# Patient Record
Sex: Male | Born: 1970 | Race: White | Hispanic: No | Marital: Married | Smoking: Former smoker
Health system: Southern US, Community
[De-identification: ages and names within clinical notes are randomized; demographics above are authoritative.]

## PROBLEM LIST (undated history)

## (undated) DIAGNOSIS — F419 Anxiety disorder, unspecified: Secondary | ICD-10-CM

## (undated) DIAGNOSIS — E785 Hyperlipidemia, unspecified: Secondary | ICD-10-CM

## (undated) DIAGNOSIS — T7840XA Allergy, unspecified, initial encounter: Secondary | ICD-10-CM

## (undated) DIAGNOSIS — R5383 Other fatigue: Secondary | ICD-10-CM

## (undated) DIAGNOSIS — Z9109 Other allergy status, other than to drugs and biological substances: Secondary | ICD-10-CM

## (undated) DIAGNOSIS — Z9852 Vasectomy status: Secondary | ICD-10-CM

## (undated) DIAGNOSIS — M109 Gout, unspecified: Secondary | ICD-10-CM

## (undated) DIAGNOSIS — B019 Varicella without complication: Secondary | ICD-10-CM

## (undated) HISTORY — DX: Other allergy status, other than to drugs and biological substances: Z91.09

## (undated) HISTORY — DX: Hyperlipidemia, unspecified: E78.5

## (undated) HISTORY — DX: Vasectomy status: Z98.52

## (undated) HISTORY — DX: Varicella without complication: B01.9

## (undated) HISTORY — DX: Gout, unspecified: M10.9

## (undated) HISTORY — DX: Other fatigue: R53.83

## (undated) HISTORY — DX: Allergy, unspecified, initial encounter: T78.40XA

---
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
## 2000-02-24 ENCOUNTER — Emergency Department (HOSPITAL_COMMUNITY): Admission: EM | Admit: 2000-02-24 | Discharge: 2000-02-24 | Payer: Self-pay | Admitting: Emergency Medicine

## 2000-02-24 ENCOUNTER — Encounter: Payer: Self-pay | Admitting: Emergency Medicine

## 2005-12-06 ENCOUNTER — Ambulatory Visit: Payer: Self-pay | Admitting: Gastroenterology

## 2005-12-09 ENCOUNTER — Ambulatory Visit: Payer: Self-pay | Admitting: Cardiovascular Disease

## 2011-06-16 ENCOUNTER — Encounter: Payer: Self-pay | Admitting: *Deleted

## 2011-06-28 ENCOUNTER — Encounter: Payer: Self-pay | Admitting: Cardiovascular Disease

## 2011-06-28 ENCOUNTER — Encounter: Payer: Self-pay | Admitting: *Deleted

## 2011-06-28 ENCOUNTER — Ambulatory Visit (INDEPENDENT_AMBULATORY_CARE_PROVIDER_SITE_OTHER): Payer: Self-pay | Admitting: Cardiovascular Disease

## 2011-06-28 DIAGNOSIS — R079 Chest pain, unspecified: Secondary | ICD-10-CM | POA: Insufficient documentation

## 2011-06-28 DIAGNOSIS — R002 Palpitations: Secondary | ICD-10-CM | POA: Insufficient documentation

## 2011-06-28 DIAGNOSIS — Z0000 Encounter for general adult medical examination without abnormal findings: Secondary | ICD-10-CM

## 2011-06-28 NOTE — Patient Instructions (Signed)
Your physician recommends that you schedule a follow-up appointment in:AS NEEDED You have been referred to PRIMARY MEDICAL  DOCTOR  [facility] OFFICE  Your physician has requested that you have an exercise tolerance test. For further information please visit [email or web address] Please also follow instruction sheet, as given. DX CHEST PAIN

## 2011-06-28 NOTE — Progress Notes (Signed)
Patient ID: Cody Hawkins, male   DOB: 10/27/1970, 41 y.o.   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  Symtoms last month or so.  Atypical aching in left breast area.  Not exertional.  Lots of stress from job.  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  Has a 42 yo and 42 yo with different woman and relationship with older son not great.  Denies drugs or stimulants.  Palpitations are fluttering more at night and when he lays down.  No syncope or family history of sudden death.  Plays basketball for recreation with no difficulty.  No previous cardiac w/u.  Only takes ASA.    ROS: Denies fever, malais, weight loss, blurry vision, decreased visual acuity, cough, sputum, SOB, hemoptysis, pleuritic pain, palpitaitons, heartburn, abdominal pain, melena, lower extremity edema, claudication, or rash.  All other systems reviewed and negative   General: Affect appropriate Healthy:  appears stated age HEENT: normal Neck supple with no adenopathy JVP normal no bruits no thyromegaly Lungs clear with no wheezing and good diaphragmatic motion Heart:  S1/S2 no murmur,rub, gallop or click PMI normal Abdomen: benighn, BS positve, no tenderness, no AAA no bruit.  No HSM or HJR Distal pulses intact with no bruits No edema Neuro non-focal Skin warm and dry No muscular weakness  Medications Current Outpatient Prescriptions  Medication Sig Dispense Refill  . aspirin 81 MG tablet Take 81 mg by mouth daily.        Allergies Tetracyclines & related  Family History: No family history on file.  Social History: History   Social History  . Marital Status: Married    Spouse Name: N/A    Number of Children: N/A  . Years of Education: N/A   Occupational History  . Not on file.   Social History Main Topics  . Smoking status: Former Smoker    Quit date: 06/20/2007  . Smokeless tobacco: Never Used  . Alcohol Use: Not on file    . Drug Use: Not on file  . Sexually Active: Not on file   Other Topics Concern  . Not on file   Social History Narrative   Patient is married and lives with his wife and twochildren.  He has a Master's degree in accounting.  He quit smoking sixmonths ago and uses beer on the weekends.  He denies a problem withalcoholism or drug abuse.    Electrocardiogram:  NSR rate 80 normal ecg  Assessment and Plan

## 2011-06-28 NOTE — Assessment & Plan Note (Signed)
Atypical likely related to job stress.  F/U ETT

## 2011-06-28 NOTE — Assessment & Plan Note (Signed)
Benign no need for monitoring in setting of normal ECG

## 2011-07-04 ENCOUNTER — Ambulatory Visit (INDEPENDENT_AMBULATORY_CARE_PROVIDER_SITE_OTHER): Payer: BC Managed Care – PPO | Admitting: Family Medicine

## 2011-07-04 ENCOUNTER — Encounter: Payer: Self-pay | Admitting: Family Medicine

## 2011-07-04 DIAGNOSIS — Z13220 Encounter for screening for lipoid disorders: Secondary | ICD-10-CM

## 2011-07-04 DIAGNOSIS — Z23 Encounter for immunization: Secondary | ICD-10-CM

## 2011-07-04 DIAGNOSIS — Z0000 Encounter for general adult medical examination without abnormal findings: Secondary | ICD-10-CM

## 2011-07-04 LAB — CBC WITH DIFFERENTIAL/PLATELET
Basophils Absolute: 0 10*3/uL (ref 0.0–0.1)
Basophils Relative: 0.3 % (ref 0.0–3.0)
Eosinophils Absolute: 0.2 10*3/uL (ref 0.0–0.7)
Eosinophils Relative: 2.6 % (ref 0.0–5.0)
HCT: 45.6 % (ref 39.0–52.0)
Hemoglobin: 15.5 g/dL (ref 13.0–17.0)
Lymphocytes Relative: 26.3 % (ref 12.0–46.0)
Lymphs Abs: 1.7 10*3/uL (ref 0.7–4.0)
MCHC: 34 g/dL (ref 30.0–36.0)
MCV: 89.2 fl (ref 78.0–100.0)
Monocytes Absolute: 0.3 10*3/uL (ref 0.1–1.0)
Monocytes Relative: 5.3 % (ref 3.0–12.0)
Neutro Abs: 4.2 10*3/uL (ref 1.4–7.7)
Neutrophils Relative %: 65.5 % (ref 43.0–77.0)
Platelets: 187 10*3/uL (ref 150.0–400.0)
RBC: 5.11 Mil/uL (ref 4.22–5.81)
RDW: 12.8 % (ref 11.5–14.6)
WBC: 6.5 10*3/uL (ref 4.5–10.5)

## 2011-07-04 LAB — BASIC METABOLIC PANEL
BUN: 13 mg/dL (ref 6–23)
CO2: 29 mEq/L (ref 19–32)
Calcium: 9.4 mg/dL (ref 8.4–10.5)
Chloride: 102 mEq/L (ref 96–112)
Creatinine, Ser: 0.8 mg/dL (ref 0.4–1.5)
GFR: 106.44 mL/min (ref >60.00)
Glucose, Bld: 97 mg/dL (ref 70–99)
Potassium: 4.2 mEq/L (ref 3.5–5.1)
Sodium: 140 mEq/L (ref 135–145)

## 2011-07-04 LAB — PSA: PSA: 3.61 ng/mL (ref 0.10–4.00)

## 2011-07-04 LAB — LIPID PANEL
HDL: 51.7 mg/dL (ref >39.00)
Triglycerides: 82 mg/dL (ref 0.0–149.0)
VLDL: 16.4 mg/dL (ref 0.0–40.0)

## 2011-07-04 LAB — HEPATIC FUNCTION PANEL
ALT: 37 U/L (ref 0–53)
AST: 31 U/L (ref 0–37)
Albumin: 4.5 g/dL (ref 3.5–5.2)
Alkaline Phosphatase: 81 U/L (ref 39–117)
Bilirubin, Direct: 0.1 mg/dL (ref 0.0–0.3)
Total Bilirubin: 0.6 mg/dL (ref 0.3–1.2)
Total Protein: 7 g/dL (ref 6.0–8.3)

## 2011-07-04 LAB — LDL CHOLESTEROL, DIRECT: Direct LDL: 137.1 mg/dL

## 2011-07-04 LAB — TSH: TSH: 0.77 u[IU]/mL (ref 0.35–5.50)

## 2011-07-04 MED ORDER — TETANUS-DIPHTH-ACELL PERTUSSIS 5-2.5-18.5 LF-MCG/0.5 IM SUSP
0.5000 mL | Freq: Once | INTRAMUSCULAR | Status: AC
  Administered 2011-07-04: 0.5 mL via INTRAMUSCULAR

## 2011-07-04 NOTE — Patient Instructions (Signed)
Follow up in 1 year or as needed We'll notify you of your lab results Keep up the good work on diet and exercise- you look great! Call with any questions or concerns Think of [place] as your home base Welcome!  We're glad to have you!

## 2011-07-04 NOTE — Progress Notes (Signed)
  Subjective:    Patient ID: Cody Hawkins, male    DOB: 1971-02-20, 41 y.o.   MRN: [id number]  HPI New to establish.  No previous PCP.    No concerns today.  Has not had CPE in ~12 yrs.   Review of Systems Patient reports no vision/hearing changes, anorexia, fever ,adenopathy, persistant/recurrent hoarseness, swallowing issues, edema, persistant/recurrent cough, hemoptysis, dyspnea (rest,exertional, paroxysmal nocturnal), gastrointestinal  bleeding (melena, rectal bleeding), abdominal pain, excessive heart burn, GU symptoms (dysuria, hematuria, voiding/incontinence issues) syncope, focal weakness, memory loss, numbness & tingling, skin/hair/nail changes, depression, anxiety, abnormal bruising/bleeding, musculoskeletal symptoms/signs.   Intermittent CP and palpitations, saw Dr Eden Emms    Objective:   Physical Exam BP 135/75  Pulse 80  Temp(Src) 98.4 F (36.9 C) (Oral)  Ht 5' 9.75" (1.772 m)  Wt 179 lb 9.6 oz (81.466 kg)  BMI 25.95 kg/m2  SpO2 98%  General Appearance:    Alert, cooperative, no distress, appears stated age  Head:    Normocephalic, without obvious abnormality, atraumatic  Eyes:    PERRL, conjunctiva/corneas clear, EOM's intact, fundi    benign, both eyes       Ears:    Normal TM's and external ear canals, both ears  Nose:   Nares normal, septum midline, mucosa normal, no drainage   or sinus tenderness  Throat:   Lips, mucosa, and tongue normal; teeth and gums normal  Neck:   Supple, symmetrical, trachea midline, no adenopathy;       thyroid:  No enlargement/tenderness/nodules  Back:     Symmetric, no curvature, ROM normal, no CVA tenderness  Lungs:     Clear to auscultation bilaterally, respirations unlabored  Chest wall:    No tenderness or deformity  Heart:    Regular rate and rhythm, S1 and S2 normal, no murmur, rub   or gallop  Abdomen:     Soft, non-tender, bowel sounds active all four quadrants,    no masses, no organomegaly  Genitalia:    Normal male  without lesion, discharge or tenderness  Rectal:    Deferred due to young age  Extremities:   Extremities normal, atraumatic, no cyanosis or edema  Pulses:   2+ and symmetric all extremities  Skin:   Skin color, texture, turgor normal, no rashes or lesions  Lymph nodes:   Cervical, supraclavicular, and axillary nodes normal  Neurologic:   CNII-XII intact. Normal strength, sensation and reflexes      throughout          Assessment & Plan:

## 2011-07-19 NOTE — Assessment & Plan Note (Signed)
New.  Pt's PE WNL.  Check labs.  Anticipatory guidance provided.  

## 2011-07-25 ENCOUNTER — Encounter: Payer: BC Managed Care – PPO | Admitting: Cardiovascular Disease

## 2012-07-03 ENCOUNTER — Encounter: Payer: BC Managed Care – PPO | Admitting: Family Medicine

## 2012-07-10 ENCOUNTER — Encounter: Payer: BC Managed Care – PPO | Admitting: Family Medicine

## 2012-08-20 ENCOUNTER — Encounter: Payer: Self-pay | Admitting: Family Medicine

## 2012-08-20 ENCOUNTER — Ambulatory Visit (INDEPENDENT_AMBULATORY_CARE_PROVIDER_SITE_OTHER): Payer: BC Managed Care – PPO | Admitting: Family Medicine

## 2012-08-20 VITALS — BP 108/70 | HR 86 | Temp 98.1°F | Ht 71.5 in | Wt 174.2 lb

## 2012-08-20 DIAGNOSIS — R6882 Decreased libido: Secondary | ICD-10-CM | POA: Insufficient documentation

## 2012-08-20 DIAGNOSIS — Z1331 Encounter for screening for depression: Secondary | ICD-10-CM

## 2012-08-20 DIAGNOSIS — F419 Anxiety disorder, unspecified: Secondary | ICD-10-CM | POA: Insufficient documentation

## 2012-08-20 DIAGNOSIS — F32A Depression, unspecified: Secondary | ICD-10-CM | POA: Insufficient documentation

## 2012-08-20 DIAGNOSIS — Z0000 Encounter for general adult medical examination without abnormal findings: Secondary | ICD-10-CM

## 2012-08-20 DIAGNOSIS — F341 Dysthymic disorder: Secondary | ICD-10-CM

## 2012-08-20 DIAGNOSIS — F329 Major depressive disorder, single episode, unspecified: Secondary | ICD-10-CM | POA: Insufficient documentation

## 2012-08-20 NOTE — Assessment & Plan Note (Signed)
Pt's PE WNL.  Check labs.  Anticipatory guidance provided.  

## 2012-08-20 NOTE — Progress Notes (Signed)
  Subjective:    Patient ID: Cody Hawkins, male    DOB: 04/01/1970, 42 y.o.   MRN: [id number]  HPI CPE- pt reports decreased energy, increased stress at work, increased anxiety, poor sleep, decreased concentration.  Now reports sex life is suffering due to decreased drive.  No obvious changes in muscle mass.   Review of Systems Patient reports no vision/hearing changes, anorexia, fever ,adenopathy, swallowing issues, chest pain, palpitations, edema, persistant/recurrent cough, hemoptysis, dyspnea (rest,exertional, paroxysmal nocturnal), gastrointestinal  bleeding (melena, rectal bleeding), abdominal pain, excessive heart burn, GU symptoms (dysuria, hematuria, voiding/incontinence issues) syncope, focal weakness, memory loss, numbness & tingling, skin/hair/nail changes, abnormal bruising/bleeding, musculoskeletal symptoms/signs.   + hoarseness x6 months, frequent talking.    Objective:   Physical Exam BP 108/70  Pulse 86  Temp(Src) 98.1 F (36.7 C) (Oral)  Ht 5' 11.5" (1.816 m)  Wt 174 lb 3.2 oz (79.017 kg)  BMI 23.96 kg/m2  SpO2 96%  General Appearance:    Alert, cooperative, no distress, appears stated age  Head:    Normocephalic, without obvious abnormality, atraumatic  Eyes:    PERRL, conjunctiva/corneas clear, EOM's intact, fundi    benign, both eyes       Ears:    Normal TM's and external ear canals, both ears  Nose:   Nares normal, septum midline, mucosa normal, no drainage   or sinus tenderness  Throat:   Lips, mucosa, and tongue normal; teeth and gums normal  Neck:   Supple, symmetrical, trachea midline, no adenopathy;       thyroid:  No enlargement/tenderness/nodules  Back:     Symmetric, no curvature, ROM normal, no CVA tenderness  Lungs:     Clear to auscultation bilaterally, respirations unlabored  Chest wall:    No tenderness or deformity  Heart:    Regular rate and rhythm, S1 and S2 normal, no murmur, rub   or gallop  Abdomen:     Soft, non-tender, bowel sounds  active all four quadrants,    no masses, no organomegaly  Genitalia:    Normal male without lesion, discharge or tenderness  Rectal:    Deferred due to young age  Extremities:   Extremities normal, atraumatic, no cyanosis or edema  Pulses:   2+ and symmetric all extremities  Skin:   Skin color, texture, turgor normal, no rashes or lesions  Lymph nodes:   Cervical, supraclavicular, and axillary nodes normal  Neurologic:   CNII-XII intact. Normal strength, sensation and reflexes      throughout          Assessment & Plan:

## 2012-08-20 NOTE — Assessment & Plan Note (Signed)
New.  Suspect this is anxiety/depression related but will check testosterone to r/o low T.  Pt expressed understanding and is in agreement w/ plan.

## 2012-08-20 NOTE — Patient Instructions (Addendum)
We'll notify you of your lab results and make any changes if needed If the testosterone is normal, consider adding a low dose depression/anxiety med Keep up the good work!  You look great! Call with any questions or concerns Have a great summer!!!

## 2012-08-20 NOTE — Assessment & Plan Note (Signed)
New.  Pt feels this is mostly centered around work.  Discussed starting meds if pt's testosterone is WNL.  Pt to think about this while waiting on labs- will follow closely.

## 2012-08-21 LAB — LIPID PANEL
Cholesterol: 200 mg/dL (ref 0–200)
HDL: 45.5 mg/dL (ref >39.00)
LDL Cholesterol: 124 mg/dL — ABNORMAL HIGH (ref 0–99)
Total CHOL/HDL Ratio: 4
Triglycerides: 154 mg/dL — ABNORMAL HIGH (ref 0.0–149.0)
VLDL: 30.8 mg/dL (ref 0.0–40.0)

## 2012-08-21 LAB — CBC WITH DIFFERENTIAL/PLATELET
Basophils Absolute: 0 10*3/uL (ref 0.0–0.1)
Eosinophils Absolute: 0 10*3/uL (ref 0.0–0.7)
HCT: 45.8 % (ref 39.0–52.0)
Lymphs Abs: 1.4 10*3/uL (ref 0.7–4.0)
MCHC: 34.1 g/dL (ref 30.0–36.0)
Monocytes Relative: 3.5 % (ref 3.0–12.0)
Neutro Abs: 6.1 10*3/uL (ref 1.4–7.7)
Platelets: 208 10*3/uL (ref 150.0–400.0)
RDW: 13.3 % (ref 11.5–14.6)

## 2012-08-21 LAB — BASIC METABOLIC PANEL
CO2: 25 mEq/L (ref 19–32)
Calcium: 9.4 mg/dL (ref 8.4–10.5)
Creatinine, Ser: 0.9 mg/dL (ref 0.4–1.5)
GFR: 104.44 mL/min (ref >60.00)
Sodium: 139 mEq/L (ref 135–145)

## 2012-08-21 LAB — HEPATIC FUNCTION PANEL
Alkaline Phosphatase: 67 U/L (ref 39–117)
Bilirubin, Direct: 0.1 mg/dL (ref 0.0–0.3)
Total Bilirubin: 0.6 mg/dL (ref 0.3–1.2)
Total Protein: 7.1 g/dL (ref 6.0–8.3)

## 2012-08-21 LAB — TESTOSTERONE, FREE, TOTAL, SHBG: Testosterone, Free: 101.4 pg/mL (ref 47.0–244.0)

## 2012-08-23 ENCOUNTER — Telehealth: Payer: Self-pay | Admitting: *Deleted

## 2012-08-23 MED ORDER — BUPROPION HCL ER (XL) 150 MG PO TB24: 150.0000 mg | ORAL_TABLET | Freq: Every day | ORAL | Status: DC

## 2012-08-23 NOTE — Telephone Encounter (Signed)
New rx sent to the pharmary by e-script.//AB/CMA

## 2012-08-23 NOTE — Telephone Encounter (Signed)
Message copied by Verdie Shire on Thu Aug 23, 2012  1:57 PM ------      Message from: Sheliah Hatch      Created: Wed Aug 22, 2012  2:41 PM       Please send script for Wellbutrin XL 150mg  daily, #30, 3 refills ------

## 2012-12-12 ENCOUNTER — Other Ambulatory Visit: Payer: Self-pay | Admitting: Family Medicine

## 2012-12-13 NOTE — Telephone Encounter (Signed)
Rx filled and sent to [facility]

## 2013-01-24 ENCOUNTER — Other Ambulatory Visit: Payer: Self-pay

## 2013-03-15 ENCOUNTER — Other Ambulatory Visit: Payer: Self-pay | Admitting: Family Medicine

## 2013-03-18 NOTE — Telephone Encounter (Signed)
Med filled.  

## 2013-06-10 ENCOUNTER — Other Ambulatory Visit: Payer: Self-pay | Admitting: Family Medicine

## 2013-06-10 NOTE — Telephone Encounter (Signed)
Med filled.  

## 2013-11-28 ENCOUNTER — Other Ambulatory Visit: Payer: Self-pay | Admitting: Family Medicine

## 2013-11-28 NOTE — Telephone Encounter (Signed)
Med filled #30.  

## 2013-11-28 NOTE — Telephone Encounter (Signed)
Pt has not been seen in over 1 yr.  Can have #30 but this will be last refill w/o appt

## 2013-11-28 NOTE — Telephone Encounter (Signed)
Please advise this was sent back to me. I filled #30 and mailed a letter to pt to schedule appt, since he has not been seen in a year.

## 2013-11-28 NOTE — Telephone Encounter (Signed)
Pt can only get #30 without an appt. Letter mailed to pt today.

## 2013-12-30 ENCOUNTER — Encounter: Payer: Self-pay | Admitting: Family Medicine

## 2013-12-30 ENCOUNTER — Ambulatory Visit (INDEPENDENT_AMBULATORY_CARE_PROVIDER_SITE_OTHER): Payer: BC Managed Care – PPO | Admitting: Family Medicine

## 2013-12-30 VITALS — BP 126/76 | HR 79 | Temp 98.1°F | Resp 16 | Wt 179.2 lb

## 2013-12-30 DIAGNOSIS — F329 Major depressive disorder, single episode, unspecified: Secondary | ICD-10-CM

## 2013-12-30 DIAGNOSIS — F418 Other specified anxiety disorders: Secondary | ICD-10-CM

## 2013-12-30 DIAGNOSIS — F419 Anxiety disorder, unspecified: Principal | ICD-10-CM

## 2013-12-30 MED ORDER — BUPROPION HCL ER (XL) 150 MG PO TB24: 150.0000 mg | ORAL_TABLET | Freq: Every day | ORAL | Status: DC

## 2013-12-30 NOTE — Assessment & Plan Note (Signed)
Pt's sxs are well controlled on Wellbutrin.  No side effects at this time from medication.  Pt is not interested in changing meds.  Refills provided.  Will follow.

## 2013-12-30 NOTE — Progress Notes (Signed)
   Subjective:    Patient ID: Cody Hawkins, male    DOB: 03/31/1970, 43 y.o.   MRN: [id number]  HPI Mood- chronic problem.  Doing much better on Wellbutrin.  'it has really helped w/ anxiety'.  Sleeping well.  Less irritable, 'more even keel'.  No anorexia, anxiety.   Review of Systems For ROS see HPI     Objective:   Physical Exam  Vitals reviewed. Constitutional: He is oriented to person, place, and time. He appears well-developed and well-nourished. No distress.  HENT:  Head: Normocephalic and atraumatic.  Neck: Normal range of motion. Neck supple. No thyromegaly present.  Cardiovascular: Normal rate, regular rhythm, normal heart sounds and intact distal pulses.   Pulmonary/Chest: Effort normal and breath sounds normal. No respiratory distress. He has no wheezes. He has no rales.  Lymphadenopathy:    He has no cervical adenopathy.  Neurological: He is alert and oriented to person, place, and time. No cranial nerve deficit. Coordination normal.  Skin: Skin is warm and dry.  Psychiatric: He has a normal mood and affect. His behavior is normal. Thought content normal.          Assessment & Plan:

## 2013-12-30 NOTE — Progress Notes (Signed)
Pre visit review using our clinic review tool, if applicable. No additional management support is needed unless otherwise documented below in the visit note. 

## 2013-12-30 NOTE — Patient Instructions (Signed)
Schedule your complete physical in 6 months Continue the Wellbutrin daily Keep up the good work!  You look great! Call with any questions or concerns Happy Fall!

## 2014-01-24 ENCOUNTER — Other Ambulatory Visit: Payer: Self-pay | Admitting: Family Medicine

## 2014-01-27 NOTE — Telephone Encounter (Signed)
Med filled.  

## 2014-06-11 ENCOUNTER — Telehealth: Payer: Self-pay | Admitting: Family Medicine

## 2014-06-11 NOTE — Telephone Encounter (Signed)
pre visit letter sent °

## 2014-07-01 ENCOUNTER — Encounter: Payer: Self-pay | Admitting: Family Medicine

## 2014-07-01 ENCOUNTER — Ambulatory Visit (INDEPENDENT_AMBULATORY_CARE_PROVIDER_SITE_OTHER): Payer: BC Managed Care – PPO | Admitting: Family Medicine

## 2014-07-01 VITALS — BP 124/74 | HR 84 | Temp 98.0°F | Resp 16 | Ht 72.5 in | Wt 180.2 lb

## 2014-07-01 DIAGNOSIS — Z0000 Encounter for general adult medical examination without abnormal findings: Secondary | ICD-10-CM

## 2014-07-01 LAB — HEPATIC FUNCTION PANEL
ALT: 24 U/L (ref 0–53)
AST: 21 U/L (ref 0–37)
Albumin: 4.4 g/dL (ref 3.5–5.2)
Alkaline Phosphatase: 79 U/L (ref 39–117)
Bilirubin, Direct: 0.1 mg/dL (ref 0.0–0.3)
TOTAL PROTEIN: 6.9 g/dL (ref 6.0–8.3)
Total Bilirubin: 0.7 mg/dL (ref 0.2–1.2)

## 2014-07-01 LAB — LIPID PANEL
Cholesterol: 207 mg/dL — ABNORMAL HIGH (ref 0–200)
HDL: 46.6 mg/dL (ref >39.00)
LDL Cholesterol: 138 mg/dL — ABNORMAL HIGH (ref 0–99)
NONHDL: 160.4
TRIGLYCERIDES: 111 mg/dL (ref 0.0–149.0)
Total CHOL/HDL Ratio: 4
VLDL: 22.2 mg/dL (ref 0.0–40.0)

## 2014-07-01 LAB — CBC WITH DIFFERENTIAL/PLATELET
BASOS ABS: 0 10*3/uL (ref 0.0–0.1)
Basophils Relative: 0.5 % (ref 0.0–3.0)
Eosinophils Absolute: 0.2 10*3/uL (ref 0.0–0.7)
Eosinophils Relative: 2.1 % (ref 0.0–5.0)
HCT: 46.4 % (ref 39.0–52.0)
HEMOGLOBIN: 15.8 g/dL (ref 13.0–17.0)
LYMPHS ABS: 2.4 10*3/uL (ref 0.7–4.0)
Lymphocytes Relative: 31.8 % (ref 12.0–46.0)
MCHC: 34.1 g/dL (ref 30.0–36.0)
MCV: 87.5 fl (ref 78.0–100.0)
MONO ABS: 0.5 10*3/uL (ref 0.1–1.0)
Monocytes Relative: 6.1 % (ref 3.0–12.0)
Neutro Abs: 4.4 10*3/uL (ref 1.4–7.7)
Neutrophils Relative %: 59.5 % (ref 43.0–77.0)
Platelets: 215 10*3/uL (ref 150.0–400.0)
RBC: 5.3 Mil/uL (ref 4.22–5.81)
RDW: 13.1 % (ref 11.5–15.5)
WBC: 7.4 10*3/uL (ref 4.0–10.5)

## 2014-07-01 LAB — BASIC METABOLIC PANEL
BUN: 14 mg/dL (ref 6–23)
CALCIUM: 9.7 mg/dL (ref 8.4–10.5)
CO2: 32 mEq/L (ref 19–32)
Chloride: 102 mEq/L (ref 96–112)
Creatinine, Ser: 1.03 mg/dL (ref 0.40–1.50)
GFR: 82.96 mL/min (ref >60.00)
GLUCOSE: 102 mg/dL — AB (ref 70–99)
Potassium: 4.2 mEq/L (ref 3.5–5.1)
Sodium: 138 mEq/L (ref 135–145)

## 2014-07-01 LAB — PSA: PSA: 2.22 ng/mL (ref 0.10–4.00)

## 2014-07-01 LAB — TSH: TSH: 1.32 u[IU]/mL (ref 0.35–4.50)

## 2014-07-01 NOTE — Progress Notes (Signed)
   Subjective:    Patient ID: Cody Hawkins, male    DOB: 07/18/1970, 44 y.o.   MRN: [id number]  HPI CPE- no concerns today.     Review of Systems Patient reports no vision/hearing changes, anorexia, fever ,adenopathy, persistant/recurrent hoarseness, swallowing issues, chest pain, palpitations, edema, persistant/recurrent cough, hemoptysis, dyspnea (rest,exertional, paroxysmal nocturnal), gastrointestinal  bleeding (melena, rectal bleeding), abdominal pain, excessive heart burn, GU symptoms (dysuria, hematuria, voiding/incontinence issues) syncope, focal weakness, memory loss, numbness & tingling, skin/hair/nail changes, depression, anxiety, abnormal bruising/bleeding, musculoskeletal symptoms/signs.     Objective:   Physical Exam BP 124/74 mmHg  Pulse 84  Temp(Src) 98 F (36.7 C) (Oral)  Resp 16  Ht 6' 0.5" (1.842 m)  Wt 180 lb 4 oz (81.761 kg)  BMI 24.10 kg/m2  SpO2 95%  General Appearance:    Alert, cooperative, no distress, appears stated age  Head:    Normocephalic, without obvious abnormality, atraumatic  Eyes:    PERRL, conjunctiva/corneas clear, EOM's intact, fundi    benign, both eyes       Ears:    Normal TM's and external ear canals, both ears  Nose:   Nares normal, septum midline, mucosa normal, no drainage   or sinus tenderness  Throat:   Lips, mucosa, and tongue normal; teeth and gums normal  Neck:   Supple, symmetrical, trachea midline, no adenopathy;       thyroid:  No enlargement/tenderness/nodules  Back:     Symmetric, no curvature, ROM normal, no CVA tenderness  Lungs:     Clear to auscultation bilaterally, respirations unlabored  Chest wall:    No tenderness or deformity  Heart:    Regular rate and rhythm, S1 and S2 normal, no murmur, rub   or gallop  Abdomen:     Soft, non-tender, bowel sounds active all four quadrants,    no masses, no organomegaly  Genitalia:    Normal male without lesion, discharge or tenderness  Rectal:    Normal tone, normal  prostate, no masses or tenderness  Extremities:   Extremities normal, atraumatic, no cyanosis or edema  Pulses:   2+ and symmetric all extremities  Skin:   Skin color, texture, turgor normal, no rashes or lesions  Lymph nodes:   Cervical, supraclavicular, and axillary nodes normal  Neurologic:   CNII-XII intact. Normal strength, sensation and reflexes      throughout          Assessment & Plan:

## 2014-07-01 NOTE — Patient Instructions (Signed)
Follow up in 1 year or as needed We'll notify you of your lab results and make any changes if needed Keep up the good work!  You look great! Call with any questions or concerns Happy Spring!!! 

## 2014-07-01 NOTE — Assessment & Plan Note (Signed)
Pt's PE WNL.  Check labs.  Anticipatory guidance provided.  

## 2014-07-01 NOTE — Progress Notes (Signed)
Pre visit review using our clinic review tool, if applicable. No additional management support is needed unless otherwise documented below in the visit note. 

## 2014-07-17 ENCOUNTER — Emergency Department (HOSPITAL_COMMUNITY)
Admission: EM | Admit: 2014-07-17 | Discharge: 2014-07-17 | Disposition: A | Discharge status: Home / Self Care | Payer: BC Managed Care – PPO | Attending: Emergency Medicine | Admitting: Emergency Medicine

## 2014-07-17 ENCOUNTER — Emergency Department (HOSPITAL_COMMUNITY): Payer: BC Managed Care – PPO

## 2014-07-17 ENCOUNTER — Encounter (HOSPITAL_COMMUNITY): Payer: Self-pay | Admitting: Emergency Medicine

## 2014-07-17 DIAGNOSIS — Z8619 Personal history of other infectious and parasitic diseases: Secondary | ICD-10-CM | POA: Insufficient documentation

## 2014-07-17 DIAGNOSIS — R109 Unspecified abdominal pain: Secondary | ICD-10-CM | POA: Insufficient documentation

## 2014-07-17 DIAGNOSIS — Z8639 Personal history of other endocrine, nutritional and metabolic disease: Secondary | ICD-10-CM | POA: Diagnosis not present

## 2014-07-17 DIAGNOSIS — R0602 Shortness of breath: Secondary | ICD-10-CM | POA: Diagnosis not present

## 2014-07-17 DIAGNOSIS — Z79899 Other long term (current) drug therapy: Secondary | ICD-10-CM | POA: Diagnosis not present

## 2014-07-17 DIAGNOSIS — Z87891 Personal history of nicotine dependence: Secondary | ICD-10-CM | POA: Insufficient documentation

## 2014-07-17 DIAGNOSIS — R110 Nausea: Secondary | ICD-10-CM | POA: Diagnosis not present

## 2014-07-17 DIAGNOSIS — R079 Chest pain, unspecified: Secondary | ICD-10-CM

## 2014-07-17 LAB — BASIC METABOLIC PANEL
Anion gap: 8 (ref 5–15)
BUN: 8 mg/dL (ref 6–23)
CO2: 27 mmol/L (ref 19–32)
Calcium: 9.3 mg/dL (ref 8.4–10.5)
Chloride: 104 mmol/L (ref 96–112)
Creatinine, Ser: 0.91 mg/dL (ref 0.50–1.35)
GFR calc Af Amer: >90 mL/min (ref >90)
GLUCOSE: 107 mg/dL — AB (ref 70–99)
POTASSIUM: 3.8 mmol/L (ref 3.5–5.1)
SODIUM: 139 mmol/L (ref 135–145)

## 2014-07-17 LAB — BRAIN NATRIURETIC PEPTIDE: B NATRIURETIC PEPTIDE 5: 5.6 pg/mL (ref 0.0–100.0)

## 2014-07-17 LAB — CBC
HCT: 45.3 % (ref 39.0–52.0)
Hemoglobin: 15.9 g/dL (ref 13.0–17.0)
MCH: 30.1 pg (ref 26.0–34.0)
MCHC: 35.1 g/dL (ref 30.0–36.0)
MCV: 85.8 fL (ref 78.0–100.0)
PLATELETS: 211 10*3/uL (ref 150–400)
RBC: 5.28 MIL/uL (ref 4.22–5.81)
RDW: 12.5 % (ref 11.5–15.5)
WBC: 5.5 10*3/uL (ref 4.0–10.5)

## 2014-07-17 LAB — I-STAT TROPONIN, ED
TROPONIN I, POC: 0 ng/mL (ref 0.00–0.08)
Troponin i, poc: 0 ng/mL (ref 0.00–0.08)

## 2014-07-17 MED ORDER — NAPROXEN 500 MG PO TABS: 500.0000 mg | ORAL_TABLET | Freq: Two times a day (BID) | ORAL | Status: DC

## 2014-07-17 NOTE — Discharge Instructions (Signed)
Naprosyn for pain and inflammation as prescribed. Follow up with your doctor for further evaluation. Return if worsening.    Chest Pain (Nonspecific) It is often hard to give a specific diagnosis for the cause of chest pain. There is always a chance that your pain could be related to something serious, such as a heart attack or a blood clot in the lungs. You need to follow up with your health care provider for further evaluation. CAUSES   Heartburn.  Pneumonia or bronchitis.  Anxiety or stress.  Inflammation around your heart (pericarditis) or lung (pleuritis or pleurisy).  A blood clot in the lung.  A collapsed lung (pneumothorax). It can develop suddenly on its own (spontaneous pneumothorax) or from trauma to the chest.  Shingles infection (herpes zoster virus). The chest wall is composed of bones, muscles, and cartilage. Any of these can be the source of the pain.  The bones can be bruised by injury.  The muscles or cartilage can be strained by coughing or overwork.  The cartilage can be affected by inflammation and become sore (costochondritis). DIAGNOSIS  Lab tests or other studies may be needed to find the cause of your pain. Your health care provider may have you take a test called an ambulatory electrocardiogram (ECG). An ECG records your heartbeat patterns over a 24-hour period. You may also have other tests, such as:  Transthoracic echocardiogram (TTE). During echocardiography, sound waves are used to evaluate how blood flows through your heart.  Transesophageal echocardiogram (TEE).  Cardiac monitoring. This allows your health care provider to monitor your heart rate and rhythm in real time.  Holter monitor. This is a portable device that records your heartbeat and can help diagnose heart arrhythmias. It allows your health care provider to track your heart activity for several days, if needed.  Stress tests by exercise or by giving medicine that makes the heart beat  faster. TREATMENT   Treatment depends on what may be causing your chest pain. Treatment may include:  Acid blockers for heartburn.  Anti-inflammatory medicine.  Pain medicine for inflammatory conditions.  Antibiotics if an infection is present.  You may be advised to change lifestyle habits. This includes stopping smoking and avoiding alcohol, caffeine, and chocolate.  You may be advised to keep your head raised (elevated) when sleeping. This reduces the chance of acid going backward from your stomach into your esophagus. Most of the time, nonspecific chest pain will improve within 2-3 days with rest and mild pain medicine.  HOME CARE INSTRUCTIONS   If antibiotics were prescribed, take them as directed. Finish them even if you start to feel better.  For the next few days, avoid physical activities that bring on chest pain. Continue physical activities as directed.  Do not use any tobacco products, including cigarettes, chewing tobacco, or electronic cigarettes.  Avoid drinking alcohol.  Only take medicine as directed by your health care provider.  Follow your health care provider's suggestions for further testing if your chest pain does not go away.  Keep any follow-up appointments you made. If you do not go to an appointment, you could develop lasting (chronic) problems with pain. If there is any problem keeping an appointment, call to reschedule. SEEK MEDICAL CARE IF:   Your chest pain does not go away, even after treatment.  You have a rash with blisters on your chest.  You have a fever. SEEK IMMEDIATE MEDICAL CARE IF:   You have increased chest pain or pain that spreads to your  arm, neck, jaw, back, or abdomen.  You have shortness of breath.  You have an increasing cough, or you cough up blood.  You have severe back or abdominal pain.  You feel nauseous or vomit.  You have severe weakness.  You faint.  You have chills. This is an emergency. Do not wait to  see if the pain will go away. Get medical help at once. Call your local emergency services (911 in [place] Do not drive yourself to the hospital. MAKE SURE YOU:   Understand these instructions.  Will watch your condition.  Will get help right away if you are not doing well or get worse. Document Released: 12/15/2004 Document Revised: 03/12/2013 Document Reviewed: 10/11/2007 [facility] Patient Information 2015 [place], [place]. This information is not intended to replace advice given to you by your health care provider. Make sure you discuss any questions you have with your health care provider.

## 2014-07-17 NOTE — ED Notes (Signed)
EMS - Patient coming from work with chest tightness that has been ongoing x 1 week with radiation to the left arm and in to the back.  Associated light headed, nausea and feeling tired.  130/80, 80 HR, 18 respirations, 98% room air.  2/10 pain.  Given 1 nitro in route with EMS and took a 324mg  Aspirin prior to arrival.

## 2014-07-17 NOTE — ED Notes (Signed)
Portable xray at bedside.

## 2014-07-17 NOTE — ED Provider Notes (Signed)
CSN: [id number]     Arrival date & time 07/17/14  1108 History   First MD Initiated Contact with Patient 07/17/14 1125     Chief Complaint  Patient presents with  . Chest Pain     (Consider location/radiation/quality/duration/timing/severity/associated sxs/prior Treatment) HPI Cody Hawkins is a 44 y.o. male with history of allergies, hyperlipidemia, presents to emergency department complaining of episodes of chest pain. Patient states he has had episodes of chest pain that are in the center of the chest and radiates to the back and arm. Symptoms last about 30 minutes at a time and resolve on its own. States symptoms are random, most of time they happen while he is at work sitting down. There is no pain or symptoms on exertion. There is no symptoms with eating. He states sometimes he does feel like his acid reflux that he one night even took some times to see if that will help. He also thinks this could be musculoskeletal because he states he has had some neck and back aches recently. He denies any prior history of heart problems, denies hypertension. He is an ex-smoker.  Past Medical History  Diagnosis Date  . Environmental allergies   . Fatigue   . H/O vasectomy   . Allergy   . Chicken pox   . Hyperlipidemia    Past Surgical History  Procedure Laterality Date  . Bone cyst excision  1983   Family History  Problem Relation Age of Onset  . Arthritis Mother   . Hyperlipidemia Mother   . Heart disease Mother   . Diabetes Maternal Grandmother   . Cancer Maternal Grandfather   . Stroke Maternal Grandfather    History  Substance Use Topics  . Smoking status: Former Smoker    Quit date: 06/20/2007  . Smokeless tobacco: Never Used  . Alcohol Use: Yes     Comment: social    Review of Systems  Constitutional: Positive for diaphoresis. Negative for fever and chills.  Respiratory: Positive for chest tightness and shortness of breath. Negative for cough.   Cardiovascular:  Positive for chest pain. Negative for palpitations and leg swelling.  Gastrointestinal: Positive for nausea and abdominal pain.  Musculoskeletal: Negative for myalgias, arthralgias, neck pain and neck stiffness.  Skin: Negative for rash.  Allergic/Immunologic: Negative for immunocompromised state.  Neurological: Negative for dizziness, weakness, light-headedness, numbness and headaches.  All other systems reviewed and are negative.     Allergies  Tetracyclines & related  Home Medications   Prior to Admission medications   Medication Sig Start Date End Date Taking? Authorizing Provider  buPROPion (WELLBUTRIN XL) 150 MG 24 hr tablet TAKE 1 TABLET BY MOUTH EVERY DAY 01/27/14   Sheliah Hatch, MD   BP 117/78 mmHg  Pulse 65  Temp(Src) 98.6 F (37 C) (Oral)  Resp 19  SpO2 93% Physical Exam  Constitutional: He is oriented to person, place, and time. He appears well-developed and well-nourished. No distress.  HENT:  Head: Normocephalic and atraumatic.  Eyes: Conjunctivae are normal.  Neck: Normal range of motion. Neck supple.  Cardiovascular: Normal rate, regular rhythm and normal heart sounds.   Pulmonary/Chest: Effort normal. No respiratory distress. He has no wheezes. He has no rales. He exhibits no tenderness.  Abdominal: Soft. Bowel sounds are normal. He exhibits no distension. There is no tenderness. There is no rebound.  Musculoskeletal: He exhibits no edema.  Neurological: He is alert and oriented to person, place, and time.  Skin: Skin is warm and  dry.  Nursing note and vitals reviewed.   ED Course  Procedures (including critical care time) Labs Review Labs Reviewed  BASIC METABOLIC PANEL - Abnormal; Notable for the following:    Glucose, Bld 107 (*)    All other components within normal limits  CBC  BRAIN NATRIURETIC PEPTIDE  I-STAT TROPOININ, ED    Imaging Review Dg Chest Port 1 View  07/17/2014   CLINICAL DATA:  Chest pain.  Chest tightness for 1 week.   EXAM: PORTABLE CHEST - 1 VIEW  COMPARISON:  None.  FINDINGS: Cardiopericardial silhouette within normal limits. Mediastinal contours normal. Trachea midline. No airspace disease or effusion. Monitoring leads project over the chest.  IMPRESSION: No active disease.   Electronically Signed   By: Andreas NewportGeoffrey  Lamke M.D.   On: 07/17/2014 11:37     EKG Interpretation   Date/Time:  Thursday July 17 2014 11:16:11 EDT Ventricular Rate:  73 PR Interval:  137 QRS Duration: 93 QT Interval:  381 QTC Calculation: 420 R Axis:   33 Text Interpretation:  Sinus rhythm Atrial premature complex since last  tracing no significant change Confirmed by BELFI  MD, MELANIE ([id number]) on  07/17/2014 11:27:18 AM      MDM   Final diagnoses:  Chest pain, unspecified chest pain type   Pt with atypical chest pain for several weeks. Pt appears anxious. Pt is relatively low risk. Will get labs, CXR, ECG normal.    Pt's labs, CXR, all unremarkable. Pt is Heart score of 1, which puts him in low risk category for ACS. Pt's VS are normal. I doubt pt has PE with normal VS and intermittent pain. Question whether this could be related to anxiety vs gerd vs musculoskeletal. Will recheck delta trop  Delta trop negative. Pt is stable for d/c home at this time with close follow up. Instructed to call pcp.   Filed Vitals:   07/17/14 1330 07/17/14 1400 07/17/14 1415 07/17/14 1500  BP: 106/87 116/74 111/72 117/80  Pulse: 76 69 73 71  Temp:      TempSrc:      Resp: 16 20 19 18   SpO2: 94% 95% 95% 93%      Jaynie Crumbleatyana Gregorey Nabor, PA-C 07/17/14 1547  Rolan BuccoMelanie Belfi, MD 07/17/14 [id number]

## 2014-07-22 ENCOUNTER — Emergency Department (HOSPITAL_COMMUNITY)
Admission: EM | Admit: 2014-07-22 | Discharge: 2014-07-23 | Disposition: A | Discharge status: Home / Self Care | Payer: BC Managed Care – PPO | Attending: Emergency Medicine | Admitting: Emergency Medicine

## 2014-07-22 ENCOUNTER — Emergency Department (HOSPITAL_COMMUNITY): Payer: BC Managed Care – PPO

## 2014-07-22 ENCOUNTER — Encounter (HOSPITAL_COMMUNITY): Payer: Self-pay | Admitting: Emergency Medicine

## 2014-07-22 ENCOUNTER — Encounter (HOSPITAL_COMMUNITY): Payer: Self-pay | Admitting: *Deleted

## 2014-07-22 ENCOUNTER — Emergency Department (HOSPITAL_COMMUNITY)
Admission: EM | Admit: 2014-07-22 | Discharge: 2014-07-22 | Disposition: A | Discharge status: Nursing Home (Includes ICF) | Payer: BC Managed Care – PPO | Source: Home / Self Care | Attending: Family Medicine | Admitting: Family Medicine

## 2014-07-22 DIAGNOSIS — Z79899 Other long term (current) drug therapy: Secondary | ICD-10-CM | POA: Insufficient documentation

## 2014-07-22 DIAGNOSIS — Z8639 Personal history of other endocrine, nutritional and metabolic disease: Secondary | ICD-10-CM | POA: Insufficient documentation

## 2014-07-22 DIAGNOSIS — Z9852 Vasectomy status: Secondary | ICD-10-CM | POA: Insufficient documentation

## 2014-07-22 DIAGNOSIS — F419 Anxiety disorder, unspecified: Secondary | ICD-10-CM | POA: Insufficient documentation

## 2014-07-22 DIAGNOSIS — Z7982 Long term (current) use of aspirin: Secondary | ICD-10-CM | POA: Insufficient documentation

## 2014-07-22 DIAGNOSIS — R5383 Other fatigue: Secondary | ICD-10-CM

## 2014-07-22 DIAGNOSIS — R079 Chest pain, unspecified: Secondary | ICD-10-CM | POA: Diagnosis not present

## 2014-07-22 DIAGNOSIS — K824 Cholesterolosis of gallbladder: Secondary | ICD-10-CM | POA: Insufficient documentation

## 2014-07-22 DIAGNOSIS — M25511 Pain in right shoulder: Secondary | ICD-10-CM | POA: Insufficient documentation

## 2014-07-22 DIAGNOSIS — Z87891 Personal history of nicotine dependence: Secondary | ICD-10-CM | POA: Insufficient documentation

## 2014-07-22 DIAGNOSIS — Z8619 Personal history of other infectious and parasitic diseases: Secondary | ICD-10-CM | POA: Insufficient documentation

## 2014-07-22 DIAGNOSIS — R0609 Other forms of dyspnea: Secondary | ICD-10-CM

## 2014-07-22 DIAGNOSIS — M25512 Pain in left shoulder: Secondary | ICD-10-CM | POA: Insufficient documentation

## 2014-07-22 DIAGNOSIS — Z791 Long term (current) use of non-steroidal anti-inflammatories (NSAID): Secondary | ICD-10-CM | POA: Insufficient documentation

## 2014-07-22 HISTORY — DX: Anxiety disorder, unspecified: F41.9

## 2014-07-22 LAB — BASIC METABOLIC PANEL
Anion gap: 10 (ref 5–15)
BUN: 7 mg/dL (ref 6–20)
CALCIUM: 9.2 mg/dL (ref 8.9–10.3)
CO2: 27 mmol/L (ref 22–32)
Chloride: 102 mmol/L (ref 101–111)
Creatinine, Ser: 1.01 mg/dL (ref 0.61–1.24)
GFR calc Af Amer: >60 mL/min (ref >60)
GLUCOSE: 100 mg/dL — AB (ref 70–99)
Potassium: 3.7 mmol/L (ref 3.5–5.1)
Sodium: 139 mmol/L (ref 135–145)

## 2014-07-22 LAB — CBC
HEMATOCRIT: 43.8 % (ref 39.0–52.0)
Hemoglobin: 15.5 g/dL (ref 13.0–17.0)
MCH: 30.2 pg (ref 26.0–34.0)
MCHC: 35.4 g/dL (ref 30.0–36.0)
MCV: 85.4 fL (ref 78.0–100.0)
PLATELETS: 215 10*3/uL (ref 150–400)
RBC: 5.13 MIL/uL (ref 4.22–5.81)
RDW: 12.4 % (ref 11.5–15.5)
WBC: 6.8 10*3/uL (ref 4.0–10.5)

## 2014-07-22 LAB — I-STAT TROPONIN, ED: Troponin i, poc: 0 ng/mL (ref 0.00–0.08)

## 2014-07-22 LAB — BRAIN NATRIURETIC PEPTIDE: B Natriuretic Peptide: 6.8 pg/mL (ref 0.0–100.0)

## 2014-07-22 NOTE — ED Notes (Signed)
Xray d/c'd; US not ready for pt at this time; Transport tech to come back when US is ready for pt

## 2014-07-22 NOTE — ED Notes (Signed)
Pt will be transferred to Ed via shuttle for further evaluation. [facility] 1st nurse

## 2014-07-22 NOTE — ED Notes (Signed)
Patient transported to Ultrasound 

## 2014-07-22 NOTE — ED Provider Notes (Signed)
CSN: [id number]     Arrival date & time 07/22/14  1438 History   First MD Initiated Contact with Patient 07/22/14 1535     Chief Complaint  Patient presents with  . Neck Pain  . Abdominal Cramping  . Back Pain  . Fatigue   (Consider location/radiation/quality/duration/timing/severity/associated sxs/prior Treatment) HPI Comments: 44 year old male states that 3 weeks ago he started feeling poorly. He states that he felt weak, had a headache pain in the back of the neck and upper mid back pain across the anterior chest worse on the left and pain in the left arm. At one point he had diaphoresis during the night. He went to an urgent care following this episode. After evaluation decided with unremarkable or positive findings that he should just take ibuprofen for his pain that was considered to be musculoskeletal that the patient declines to do so taking that that would not work. 5 days ago he was experiencing chest tightness with radiation to the left arm and called an ambulance and was [facility]. He went through the chest pain protocol workup. Enzymes were not negative. Lab work was normal. EKG and chest x-ray were all unremarkable. He was told to be evaluated by speech CP and discharge. He does have an appointment in 3 days. Today he had more time to think about his complaints. They are the same he decided to get on the treadmill last night and walk but was unable to stay on the treadmill very long and he got short of breath and again started having left chest pain and pain in his upper back. Is also having some reflux-type symptoms.   Past Medical History  Diagnosis Date  . Environmental allergies   . Fatigue   . H/O vasectomy   . Allergy   . Chicken pox   . Hyperlipidemia   . Anxiety    Past Surgical History  Procedure Laterality Date  . Bone cyst excision  1983   Family History  Problem Relation Age of Onset  . Arthritis Mother   . Hyperlipidemia  Mother   . Heart disease Mother   . Diabetes Maternal Grandmother   . Cancer Maternal Grandfather   . Stroke Maternal Grandfather    History  Substance Use Topics  . Smoking status: Former Smoker    Quit date: 06/20/2007  . Smokeless tobacco: Never Used  . Alcohol Use: Yes     Comment: social    Review of Systems  Constitutional: Positive for activity change, appetite change and fatigue. Negative for fever.  HENT: Negative.   Eyes: Negative.   Respiratory: Positive for shortness of breath. Negative for cough and wheezing.   Cardiovascular: Positive for chest pain. Negative for leg swelling.  Gastrointestinal: Negative.   Genitourinary: Negative.   Musculoskeletal: Positive for myalgias.  Skin: Negative.   Neurological: Positive for weakness. Negative for dizziness, tremors, seizures, syncope, facial asymmetry, speech difficulty and numbness.  Psychiatric/Behavioral: The patient is nervous/anxious.     Allergies  Tetracyclines & related  Home Medications   Prior to Admission medications   Medication Sig Start Date End Date Taking? Authorizing Provider  aspirin 81 MG chewable tablet Chew 324 mg by mouth once.    Historical Provider, MD  buPROPion (WELLBUTRIN XL) 150 MG 24 hr tablet TAKE 1 TABLET BY MOUTH EVERY DAY 01/27/14   Sheliah HatchKatherine E Tabori, MD  HYDROcodone-acetaminophen (NORCO/VICODIN) 5-325 MG per tablet Take 1 tablet by mouth every 6 (six) hours as needed  for moderate pain.    Historical Provider, MD  naproxen (NAPROSYN) 500 MG tablet Take 1 tablet (500 mg total) by mouth 2 (two) times daily. 07/17/14   Tatyana Kirichenko, PA-C   BP 114/71 mmHg  Pulse 75  Temp(Src) 97.9 F (36.6 C) (Oral)  Resp 14  SpO2 96% Physical Exam  Constitutional: He is oriented to person, place, and time. He appears well-developed and well-nourished. No distress.  Eyes: EOM are normal. Pupils are equal, round, and reactive to light.  Neck: Normal range of motion. Neck supple.   Cardiovascular: Normal rate, regular rhythm, normal heart sounds and intact distal pulses.   Pulmonary/Chest: Effort normal and breath sounds normal. No respiratory distress. He has no wheezes. He has no rales. He exhibits tenderness.  Left anterior chest wall tenderness. Patient states that although he is had this before it is not the same pain for which he presents today and for which she presented to the ER 4 days ago.  Musculoskeletal: He exhibits no edema.  There is tenderness to the upper back musculature, posterior cervical musculature and rhomboid muscles. It is partially elicited with forward flexion of the head and neck.  Lymphadenopathy:    He has no cervical adenopathy.  Neurological: He is alert and oriented to person, place, and time.  Skin: Skin is warm and dry.  Psychiatric: He has a normal mood and affect.  Nursing note and vitals reviewed.   ED Course  Procedures (including critical care time) Labs Review Labs Reviewed - No data to display  Imaging Review No results found. ED ECG REPORT   Date: 07/22/2014  Rate: 55  Rhythm: sinus bradycardia   QRS Axis:  Intervals:   ST/T Wave abnormalities: none  Conduction Disutrbances:none, no ectopy  Narrative Interpretation:   Old EKG Reviewed: no change  I have personally reviewed the EKG tracing and agree with the computerized printout as noted.   MDM   1. Chest pain, unspecified chest pain type   2. DOE (dyspnea on exertion)   3. Other fatigue    To [place] via shuttle for chest pain evaluation. The findings of the evaluation from the recent ER visits were discussed with the patient. His EKG is normal at a rate of 55, sinus bradycardia. He has very low risk factors for cardiac disease. Despite reassurance the patient wants to know exactly what is going on now. For that to occur he will have to have another chest pain workup. He is stable and will be transferred to .   Hayden Rasmussen, NP 07/22/14 [phone]

## 2014-07-22 NOTE — ED Notes (Signed)
Pt states that for over a month he has had nausea and emesis with neck and back pain.. Pt states that he has had weakness and fatigue with night sweats

## 2014-07-22 NOTE — ED Notes (Signed)
MD at bedside. 

## 2014-07-22 NOTE — ED Notes (Signed)
Pt reports ongoing chest pain and sob. Pt reports burning in chest. Pt states that he has felt fatigued as well.

## 2014-07-23 NOTE — Discharge Instructions (Signed)
Return to the ED with any concerns including difficulty breathing, fainting, vomiting and not able to keep down liquids, leg swelling, decreased level of alertness/lethargy, or any other alarming symptoms   The ultrasound showed polyps of your gallbladder, this should be followed up with your primary care doctor to determine if further evaluation is needed.

## 2014-07-23 NOTE — ED Provider Notes (Signed)
CSN: [id number]     Arrival date & time 07/22/14  1803 History   First MD Initiated Contact with Patient 07/22/14 2016     Chief Complaint  Patient presents with  . Chest Pain     (Consider location/radiation/quality/duration/timing/severity/associated sxs/prior Treatment) HPI  Pt presenting with c/o ongoing episodes of chest discomfort.  He describes pain as midsternal, also feels an aching bilateral shoulders and between shoulder blades when pain occurs.  He has felt intermittently weak and fatigued as well.  He states the pain feels like  Burning sensation and seems to be worse after eating meals.  He was seen in the ED several days ago for similar symptoms, also seen at urgent care today for his symptoms.  He was sent to the ED for further evaluation today. He has no diaphoresis, no nausea.  No leg swelling.  No hx of DVT/PE, no recent travel/trauma/surgery.  He is very anxious about his symptoms and would like a definitive diagnosis tonight.  There are no other associated systemic symptoms, there are no other alleviating or modifying factors.   Past Medical History  Diagnosis Date  . Environmental allergies   . Fatigue   . H/O vasectomy   . Allergy   . Chicken pox   . Hyperlipidemia   . Anxiety    Past Surgical History  Procedure Laterality Date  . Bone cyst excision  1983   Family History  Problem Relation Age of Onset  . Arthritis Mother   . Hyperlipidemia Mother   . Heart disease Mother   . Diabetes Maternal Grandmother   . Cancer Maternal Grandfather   . Stroke Maternal Grandfather    History  Substance Use Topics  . Smoking status: Former Smoker    Quit date: 06/20/2007  . Smokeless tobacco: Never Used  . Alcohol Use: Yes     Comment: social    Review of Systems  ROS reviewed and all otherwise negative except for mentioned in HPI    Allergies  Tetracyclines & related  Home Medications   Prior to Admission medications   Medication Sig Start Date End  Date Taking? Authorizing Provider  aspirin 81 MG chewable tablet Chew 324 mg by mouth daily as needed for mild pain.    Yes Historical Provider, MD  buPROPion (WELLBUTRIN XL) 150 MG 24 hr tablet TAKE 1 TABLET BY MOUTH EVERY DAY 01/27/14  Yes Sheliah HatchKatherine E Tabori, MD  HYDROcodone-acetaminophen (NORCO/VICODIN) 5-325 MG per tablet Take 1 tablet by mouth every 6 (six) hours as needed for moderate pain.   Yes Historical Provider, MD  Multiple Vitamin (MULTIVITAMIN WITH MINERALS) TABS tablet Take 1 tablet by mouth daily.   Yes Historical Provider, MD  naproxen (NAPROSYN) 500 MG tablet Take 1 tablet (500 mg total) by mouth 2 (two) times daily. 07/17/14  Yes Tatyana Kirichenko, PA-C  Ranitidine HCl (ACID REDUCER PO) Take 1 tablet by mouth daily as needed. For acid   Yes Historical Provider, MD   BP 115/67 mmHg  Pulse 59  Temp(Src) 97.9 F (36.6 C) (Oral)  Resp 18  Ht 6' (1.829 m)  Wt 180 lb (81.647 kg)  BMI 24.41 kg/m2  SpO2 97%  Vitals reviewed Physical Exam  Physical Examination: General appearance - alert, well appearing, and in no distress Mental status - alert, oriented to person, place, and time Eyes - no scleral icterus, no conjunctival injection Mouth - mucous membranes moist, pharynx normal without lesions Chest - clear to auscultation, no wheezes, rales or rhonchi, symmetric  air entry Heart - normal rate, regular rhythm, normal S1, S2, no murmurs, rubs, clicks or gallops Abdomen - soft, nontender, nondistended, no masses or organomegaly, nabs Extremities - peripheral pulses normal, no pedal edema, no clubbing or cyanosis Skin - normal coloration and turgor, no rashes  ED Course  Procedures (including critical care time) Labs Review Labs Reviewed  BASIC METABOLIC PANEL - Abnormal; Notable for the following:    Glucose, Bld 100 (*)    All other components within normal limits  CBC  BRAIN NATRIURETIC PEPTIDE  I-STAT TROPOININ, ED    Imaging Review Dg Chest 2 View  07/22/2014    CLINICAL DATA:  Mid chest pain hip pain radiating down left humerus. Progressive pain several weeks  EXAM: CHEST  2 VIEW  COMPARISON:  Radiograph 07/17/2014  FINDINGS: Normal mediastinum and cardiac silhouette. Normal pulmonary vasculature. No evidence of effusion, infiltrate, or pneumothorax. No acute bony abnormality. There is expansile deformity of the proximal right humerus with sclerosis .  IMPRESSION: 1.  No acute cardiopulmonary process. 2. Sclerotic expansile deformity of the right proximal humerus. Recommend dedicated radiographs of the right humerus.   Electronically Signed   By: Genevive BiStewart  Edmunds M.D.   On: 07/22/2014 19:48   [place] Abdomen Limited  07/22/2014   CLINICAL DATA:  Chest pain for 3 weeks  EXAM: US ABDOMEN LIMITED - RIGHT UPPER QUADRANT  COMPARISON:  None.  FINDINGS: Gallbladder:  There is a 3 mm polyp, and several additional smaller presumed polyps. No calculi are evident. There is no gallbladder wall thickening. The patient was not tender over the gallbladder.  Common bile duct:  Diameter: 4.2 mm, normal.  Liver:  No focal lesion identified. Within normal limits in parenchymal echogenicity.  IMPRESSION: Gallbladder polyps.  Otherwise unremarkable.   Electronically Signed   By: Ellery Plunkaniel R Camargo M.D.   On: 07/22/2014 23:17     EKG Interpretation   Date/Time:  Tuesday Jul 22 2014 18:47:12 EDT Ventricular Rate:  67 PR Interval:  156 QRS Duration: 92 QT Interval:  380 QTC Calculation: 401 R Axis:   86 Text Interpretation:  Normal sinus rhythm Normal ECG No significant change  since last tracing Confirmed by [facility]  MD, Josejuan Hoaglin [phone]([id number]) on 07/22/2014  8:33:02 PM      MDM   Final diagnoses:  Gallbladder polyp  Chest pain, unspecified chest pain type    Pt presenting with c/o chest pain, pain in bilateral shoulders- has had workup last week for chest pain which was reassuring, again today labs, EKG and CXR are reassuring.  Abdominal ultrasound obtained as symptoms of radiation to  shoulders and worse after eating could represent biliary disease- no acute abnormalities found- did show gallbladder polyps- low suspicion for carcinoma given size, mulitple polyps and patient age- patient was informed of this finding and has appointment in 3 days with PMD- advised to keep this appointment for further evaluation of symptoms.  Pt is PERC 0, low heart score, doubt ACS/PE.   Xray images reviewed and interpreted by me as well.  Nursing notes including past medical history and social history reviewed and considered in documentation Prior records reviewed and considered during this visit     Jerelyn ScottMartha Linker, MD 07/23/14 1815

## 2014-07-25 ENCOUNTER — Encounter: Payer: Self-pay | Admitting: Family Medicine

## 2014-07-25 ENCOUNTER — Ambulatory Visit (INDEPENDENT_AMBULATORY_CARE_PROVIDER_SITE_OTHER): Payer: BC Managed Care – PPO | Admitting: Family Medicine

## 2014-07-25 VITALS — BP 122/80 | HR 93 | Temp 98.0°F | Resp 16 | Wt 180.4 lb

## 2014-07-25 DIAGNOSIS — K219 Gastro-esophageal reflux disease without esophagitis: Secondary | ICD-10-CM | POA: Diagnosis not present

## 2014-07-25 DIAGNOSIS — R002 Palpitations: Secondary | ICD-10-CM

## 2014-07-25 DIAGNOSIS — R0789 Other chest pain: Secondary | ICD-10-CM

## 2014-07-25 LAB — H. PYLORI ANTIBODY, IGG: H PYLORI IGG: NEGATIVE

## 2014-07-25 MED ORDER — OMEPRAZOLE 40 MG PO CPDR: 40.0000 mg | DELAYED_RELEASE_CAPSULE | Freq: Every day | ORAL | Status: DC

## 2014-07-25 NOTE — Patient Instructions (Signed)
Follow up in 2 weeks to recheck symptoms We'll notify you of your lab results and make any changes if needed Keep a journal of your symptoms- how long they last, what you are doing when they start, what makes them better/worse, etc Start the Omeprazole daily for possible reflux We'll call you with your stress test Call with any questions or concerns Hang in there!

## 2014-07-25 NOTE — Progress Notes (Signed)
Pre visit review using our clinic review tool, if applicable. No additional management support is needed unless otherwise documented below in the visit note. 

## 2014-07-25 NOTE — Progress Notes (Signed)
   Subjective:    Patient ID: Cody Hawkins, male    DOB: 09/24/1970, 44 y.o.   MRN: [id number]  HPI ER f/u- pt was seen in ER on 4/28 and 5/3 for CP.  Pt had EKGs and CXR both visits w/o dx.  Pt was told that cardiac etiology was unlikely.  Had abd US that showed gallbladder polyps.  Pt reports that when sitting at work can have sudden onset of 'not feeling good', chest tightness radiating into arms, SOB, tingling of fingertips, nausea, no diarrhea.  Denies increased stress/anxiety.  Pt also notes he has separate episodes that is more consistent w/ GERD- abd distention, pain, mild nausea.  Last episode occurred last night ~10 pm while at rest.   Review of Systems For ROS see HPI     Objective:   Physical Exam  Constitutional: He is oriented to person, place, and time. He appears well-developed and well-nourished. No distress.  HENT:  Head: Normocephalic and atraumatic.  Eyes: Conjunctivae and EOM are normal. Pupils are equal, round, and reactive to light.  Neck: Normal range of motion. Neck supple. No thyromegaly present.  Cardiovascular: Normal rate, regular rhythm, normal heart sounds and intact distal pulses.   No murmur heard. Pulmonary/Chest: Effort normal and breath sounds normal. No respiratory distress.  Abdominal: Soft. Bowel sounds are normal. He exhibits no distension.  Musculoskeletal: He exhibits no edema.  Lymphadenopathy:    He has no cervical adenopathy.  Neurological: He is alert and oriented to person, place, and time. No cranial nerve deficit.  Skin: Skin is warm and dry.  Psychiatric: He has a normal mood and affect. His behavior is normal.  Vitals reviewed.         Assessment & Plan:

## 2014-07-26 LAB — TSH: TSH: 1.747 u[IU]/mL (ref 0.350–4.500)

## 2014-07-26 LAB — T3, FREE: T3, Free: 3.6 pg/mL (ref 2.3–4.2)

## 2014-07-26 LAB — T4, FREE: FREE T4: 1.12 ng/dL (ref 0.80–1.80)

## 2014-07-27 NOTE — Assessment & Plan Note (Signed)
New.  Check labs to r/o h pylori and start daily PPI.  Reviewed dietary and lifestyle modifications.  Will follow.

## 2014-07-27 NOTE — Assessment & Plan Note (Signed)
New.  May be anxiety related (although pt denies) vs hyperadrenergic state.  Check labs to r/o possible pheo.  Repeat thyroid studies as pt was asymptomatic at time of his physical.  Will continue to follow closely.

## 2014-07-27 NOTE — Assessment & Plan Note (Signed)
New.  Pt's reported sxs seem to be 2 separate events- GERD vs apparent hyperadrenergic episode/panic attack.  Has had normal EKGs on multiple occasions.  Is asymptomatic today so there is no need to repeat.  Start daily PPI to tx the reflux component.  Check labs to r/o H pylori and also to r/o possible pheo (although this would be unlikely).  Get stress test to risk stratify.  Will follow closely.

## 2014-07-28 ENCOUNTER — Encounter: Payer: Self-pay | Admitting: Family Medicine

## 2014-07-29 LAB — CATECHOLAMINES, FRACTIONATED, PLASMA
CATECHOLAMINES, TOTAL: 692 pg/mL
DOPAMINE: 32 pg/mL
Epinephrine: 94 pg/mL
Norepinephrine: 598 pg/mL

## 2014-08-13 ENCOUNTER — Ambulatory Visit (INDEPENDENT_AMBULATORY_CARE_PROVIDER_SITE_OTHER): Payer: BC Managed Care – PPO | Admitting: Physician Assistant

## 2014-08-13 DIAGNOSIS — R0789 Other chest pain: Secondary | ICD-10-CM | POA: Diagnosis not present

## 2014-08-13 LAB — EXERCISE TOLERANCE TEST
CHL CUP MPHR: 175 {beats}/min
CHL RATE OF PERCEIVED EXERTION: 19
CSEPHR: 89 %
CSEPPHR: 157 {beats}/min
Estimated workload: 13.4 METS
Exercise duration (min): 12 min
Exercise duration (sec): 0 s
Rest HR: 88 {beats}/min

## 2014-08-14 ENCOUNTER — Encounter: Payer: Self-pay | Admitting: Family Medicine

## 2014-08-14 ENCOUNTER — Ambulatory Visit (INDEPENDENT_AMBULATORY_CARE_PROVIDER_SITE_OTHER): Payer: BC Managed Care – PPO | Admitting: Family Medicine

## 2014-08-14 VITALS — BP 111/72 | HR 75 | Resp 16 | Ht 72.0 in | Wt 187.4 lb

## 2014-08-14 DIAGNOSIS — R0789 Other chest pain: Secondary | ICD-10-CM | POA: Diagnosis not present

## 2014-08-14 MED ORDER — BUPROPION HCL ER (XL) 150 MG PO TB24: 150.0000 mg | ORAL_TABLET | Freq: Every day | ORAL | Status: DC

## 2014-08-14 NOTE — Patient Instructions (Signed)
Follow up as needed for as scheduled for your physical Continue the Omeprazole daily No need for further work up at this time Call with any questions or concerns Have a great holiday weekend!!!

## 2014-08-14 NOTE — Assessment & Plan Note (Signed)
Improved since starting Omeprazole.  No longer having SOB, chest pain or pressure.  Had normal stress test.  No need for additional work up.  Will follow.

## 2014-08-14 NOTE — Progress Notes (Signed)
   Subjective:    Patient ID: Cody Hawkins, male    DOB: 07/09/1970, 44 y.o.   MRN: [id number]  HPI Chest pain- pt reports sxs are 'much better' since starting Omeprazole.  The first week he had ~3 episodes of 'breathlessness and palpitations' but they were more mild than previously.  Since that 1st week has had no additional episodes.  Stress test was normal.    Review of Systems For ROS see HPI     Objective:   Physical Exam  Constitutional: He is oriented to person, place, and time. He appears well-developed and well-nourished. No distress.  HENT:  Head: Normocephalic and atraumatic.  Eyes: Conjunctivae and EOM are normal. Pupils are equal, round, and reactive to light.  Neck: Normal range of motion. Neck supple. No thyromegaly present.  Cardiovascular: Normal rate, regular rhythm, normal heart sounds and intact distal pulses.   No murmur heard. Pulmonary/Chest: Effort normal and breath sounds normal. No respiratory distress.  Abdominal: Soft. Bowel sounds are normal. He exhibits no distension.  Musculoskeletal: He exhibits no edema.  Lymphadenopathy:    He has no cervical adenopathy.  Neurological: He is alert and oriented to person, place, and time. No cranial nerve deficit.  Skin: Skin is warm and dry.  Psychiatric: He has a normal mood and affect. His behavior is normal.  Vitals reviewed.         Assessment & Plan:

## 2014-08-14 NOTE — Progress Notes (Signed)
Pre visit review using our clinic review tool, if applicable. No additional management support is needed unless otherwise documented below in the visit note. 

## 2014-11-04 ENCOUNTER — Telehealth: Payer: Self-pay | Admitting: *Deleted

## 2014-11-04 NOTE — Telephone Encounter (Signed)
Prior authorization for omeprazole initiated through [facility]. Awaiting determination. JG//CMA

## 2014-11-10 ENCOUNTER — Encounter: Payer: Self-pay | Admitting: Family Medicine

## 2014-11-10 MED ORDER — OMEPRAZOLE 40 MG PO CPDR: 40.0000 mg | DELAYED_RELEASE_CAPSULE | Freq: Every day | ORAL | Status: DC

## 2014-11-10 NOTE — Telephone Encounter (Signed)
Med filled.  

## 2014-11-18 ENCOUNTER — Telehealth: Payer: Self-pay | Admitting: Family Medicine

## 2014-11-18 NOTE — Telephone Encounter (Signed)
Caller name: Morrell Riddle  Relationship to patient: pharmacy Can be reached:  Pharmacy:  Reason for call: She says that she has been working on pt's prescription. She wanted to let you know that she changed the pt's script to 20 mg 2 caps a day

## 2014-11-18 NOTE — Telephone Encounter (Signed)
Received this message from pt's insurance "Drug is covered by current benefit plan. No further PA activity needed."

## 2014-11-19 NOTE — Telephone Encounter (Signed)
Spoke with jennifer at the [facility]. She advised that it is the pt's omeprazole. Per insurance they will not cover the prescription but they will cover the OTC.

## 2015-02-23 ENCOUNTER — Other Ambulatory Visit: Payer: Self-pay | Admitting: Family Medicine

## 2015-02-23 NOTE — Telephone Encounter (Signed)
Medication filled to pharmacy as requested.   

## 2015-07-03 ENCOUNTER — Encounter: Payer: BC Managed Care – PPO | Admitting: Family Medicine

## 2015-07-03 ENCOUNTER — Other Ambulatory Visit: Payer: Self-pay | Admitting: Family Medicine

## 2015-07-06 NOTE — Telephone Encounter (Signed)
Medication filled to pharmacy as requested.   

## 2015-07-16 ENCOUNTER — Encounter: Payer: Self-pay | Admitting: Family Medicine

## 2015-07-16 ENCOUNTER — Ambulatory Visit (INDEPENDENT_AMBULATORY_CARE_PROVIDER_SITE_OTHER): Payer: BC Managed Care – PPO | Admitting: Family Medicine

## 2015-07-16 ENCOUNTER — Other Ambulatory Visit (INDEPENDENT_AMBULATORY_CARE_PROVIDER_SITE_OTHER): Payer: BC Managed Care – PPO

## 2015-07-16 VITALS — BP 118/82 | HR 76 | Temp 98.3°F | Resp 16 | Ht 72.0 in | Wt 168.4 lb

## 2015-07-16 DIAGNOSIS — Z0000 Encounter for general adult medical examination without abnormal findings: Secondary | ICD-10-CM | POA: Diagnosis not present

## 2015-07-16 LAB — LIPID PANEL
CHOLESTEROL: 229 mg/dL — AB (ref 0–200)
HDL: 46.1 mg/dL (ref >39.00)
LDL Cholesterol: 153 mg/dL — ABNORMAL HIGH (ref 0–99)
NonHDL: 183.21
Total CHOL/HDL Ratio: 5
Triglycerides: 153 mg/dL — ABNORMAL HIGH (ref 0.0–149.0)
VLDL: 30.6 mg/dL (ref 0.0–40.0)

## 2015-07-16 LAB — BASIC METABOLIC PANEL
BUN: 15 mg/dL (ref 6–23)
CHLORIDE: 104 meq/L (ref 96–112)
CO2: 31 meq/L (ref 19–32)
Calcium: 10 mg/dL (ref 8.4–10.5)
Creatinine, Ser: 0.93 mg/dL (ref 0.40–1.50)
GFR: 92.91 mL/min (ref >60.00)
GLUCOSE: 82 mg/dL (ref 70–99)
POTASSIUM: 4.2 meq/L (ref 3.5–5.1)
Sodium: 142 mEq/L (ref 135–145)

## 2015-07-16 LAB — CBC WITH DIFFERENTIAL/PLATELET
BASOS ABS: 0 10*3/uL (ref 0.0–0.1)
Basophils Relative: 0.6 % (ref 0.0–3.0)
EOS ABS: 0.2 10*3/uL (ref 0.0–0.7)
Eosinophils Relative: 3.1 % (ref 0.0–5.0)
HCT: 47.3 % (ref 39.0–52.0)
Hemoglobin: 16.2 g/dL (ref 13.0–17.0)
LYMPHS ABS: 2.4 10*3/uL (ref 0.7–4.0)
Lymphocytes Relative: 35.3 % (ref 12.0–46.0)
MCHC: 34.3 g/dL (ref 30.0–36.0)
MCV: 86.7 fl (ref 78.0–100.0)
Monocytes Absolute: 0.4 10*3/uL (ref 0.1–1.0)
Monocytes Relative: 5.3 % (ref 3.0–12.0)
NEUTROS ABS: 3.8 10*3/uL (ref 1.4–7.7)
NEUTROS PCT: 55.7 % (ref 43.0–77.0)
PLATELETS: 244 10*3/uL (ref 150.0–400.0)
RBC: 5.46 Mil/uL (ref 4.22–5.81)
RDW: 13.1 % (ref 11.5–15.5)
WBC: 6.8 10*3/uL (ref 4.0–10.5)

## 2015-07-16 LAB — HEPATIC FUNCTION PANEL
ALK PHOS: 81 U/L (ref 39–117)
ALT: 21 U/L (ref 0–53)
AST: 17 U/L (ref 0–37)
Albumin: 4.7 g/dL (ref 3.5–5.2)
Bilirubin, Direct: 0.1 mg/dL (ref 0.0–0.3)
TOTAL PROTEIN: 7.1 g/dL (ref 6.0–8.3)
Total Bilirubin: 0.3 mg/dL (ref 0.2–1.2)

## 2015-07-16 LAB — TSH: TSH: 1.11 u[IU]/mL (ref 0.35–4.50)

## 2015-07-16 LAB — PSA: PSA: 0.95 ng/mL (ref 0.10–4.00)

## 2015-07-16 NOTE — Progress Notes (Signed)
Pre visit review using our clinic review tool, if applicable. No additional management support is needed unless otherwise documented below in the visit note. 

## 2015-07-16 NOTE — Progress Notes (Signed)
   Subjective:    Patient ID: Cody Hawkins, male    DOB: 01/04/1971, 45 y.o.   MRN: [id number]  HPI CPE- pt has lost 20 lbs since last visit.  Pt reports weight loss is intentional through diet changes and exercise.  No concerns.   Review of Systems Patient reports no vision/hearing changes, anorexia, fever ,adenopathy, persistant/recurrent hoarseness, swallowing issues, chest pain, palpitations, edema, persistant/recurrent cough, hemoptysis, dyspnea (rest,exertional, paroxysmal nocturnal), gastrointestinal  bleeding (melena, rectal bleeding), abdominal pain, excessive heart burn, GU symptoms (dysuria, hematuria, voiding/incontinence issues) syncope, focal weakness, memory loss, numbness & tingling, skin/hair/nail changes, depression, anxiety, abnormal bruising/bleeding, musculoskeletal symptoms/signs.     Objective:   Physical Exam BP 118/82 mmHg  Pulse 76  Temp(Src) 98.3 F (36.8 C) (Oral)  Resp 16  Ht 6' (1.829 m)  Wt 168 lb 6 oz (76.374 kg)  BMI 22.83 kg/m2  SpO2 97%  General Appearance:    Alert, cooperative, no distress, appears stated age  Head:    Normocephalic, without obvious abnormality, atraumatic  Eyes:    PERRL, conjunctiva/corneas clear, EOM's intact, fundi    benign, both eyes       Ears:    Normal TM's and external ear canals, both ears  Nose:   Nares normal, septum midline, mucosa normal, no drainage   or sinus tenderness  Throat:   Lips, mucosa, and tongue normal; teeth and gums normal  Neck:   Supple, symmetrical, trachea midline, no adenopathy;       thyroid:  No enlargement/tenderness/nodules  Back:     Symmetric, no curvature, ROM normal, no CVA tenderness  Lungs:     Clear to auscultation bilaterally, respirations unlabored  Chest wall:    No tenderness or deformity  Heart:    Regular rate and rhythm, S1 and S2 normal, no murmur, rub   or gallop  Abdomen:     Soft, non-tender, bowel sounds active all four quadrants,    no masses, no organomegaly    Genitalia:    Normal male without lesion, discharge or tenderness  Rectal:    Normal tone, normal prostate, no masses or tenderness  Extremities:   Extremities normal, atraumatic, no cyanosis or edema  Pulses:   2+ and symmetric all extremities  Skin:   Skin color, texture, turgor normal, no rashes or lesions  Lymph nodes:   Cervical, supraclavicular, and axillary nodes normal  Neurologic:   CNII-XII intact. Normal strength, sensation and reflexes      throughout          Assessment & Plan:

## 2015-07-16 NOTE — Assessment & Plan Note (Signed)
Pt's PE WNL.  Check labs.  Anticipatory guidance provided.  

## 2015-07-16 NOTE — Patient Instructions (Signed)
[facility] your [facility] today (sorry for the inconvenience!) Schedule a physical with me in 1 year Keep up the good work on healthy diet and regular exercise- you look great! Call with any questions or concerns Thanks for sticking with us! Happy Spring!!!

## 2015-07-17 ENCOUNTER — Other Ambulatory Visit: Payer: Self-pay | Admitting: General Practice

## 2015-07-17 DIAGNOSIS — E785 Hyperlipidemia, unspecified: Secondary | ICD-10-CM

## 2015-07-17 MED ORDER — ATORVASTATIN CALCIUM 20 MG PO TABS: 20.0000 mg | ORAL_TABLET | Freq: Every day | ORAL | Status: DC

## 2015-08-05 ENCOUNTER — Other Ambulatory Visit: Payer: Self-pay | Admitting: Family Medicine

## 2015-08-05 NOTE — Telephone Encounter (Signed)
Medication filled to pharmacy as requested.   

## 2016-01-15 ENCOUNTER — Encounter: Payer: Self-pay | Admitting: Family Medicine

## 2016-01-15 ENCOUNTER — Ambulatory Visit (INDEPENDENT_AMBULATORY_CARE_PROVIDER_SITE_OTHER): Payer: BC Managed Care – PPO | Admitting: Family Medicine

## 2016-01-15 VITALS — BP 123/84 | HR 82 | Temp 98.2°F | Resp 16 | Ht 72.0 in | Wt 179.5 lb

## 2016-01-15 DIAGNOSIS — K219 Gastro-esophageal reflux disease without esophagitis: Secondary | ICD-10-CM | POA: Diagnosis not present

## 2016-01-15 DIAGNOSIS — R0789 Other chest pain: Secondary | ICD-10-CM | POA: Diagnosis not present

## 2016-01-15 MED ORDER — PANTOPRAZOLE SODIUM 40 MG PO TBEC: 40.0000 mg | DELAYED_RELEASE_TABLET | Freq: Every day | ORAL | 3 refills | Status: DC

## 2016-01-15 MED ORDER — GI COCKTAIL ~~LOC~~
30.0000 mL | Freq: Once | ORAL | Status: AC
  Administered 2016-01-15: 30 mL via ORAL

## 2016-01-15 MED ORDER — SUCRALFATE 1 G PO TABS: 1.0000 g | ORAL_TABLET | Freq: Three times a day (TID) | ORAL | 1 refills | Status: DC

## 2016-01-15 NOTE — Progress Notes (Signed)
Pre visit review using our clinic review tool, if applicable. No additional management support is needed unless otherwise documented below in the visit note. 

## 2016-01-15 NOTE — Assessment & Plan Note (Signed)
Deteriorated.  Chest discomfort is not consistent w/ cardiac sxs- no difficulty w/ exercise, no SOB.  Pt is having sour brash, nocturnal sxs, abdominal distension, belching.  No relief today w/ GI cocktail or doubling the OTC PPI x5 days.  Pt has high levels of stress at work which is likely exacerbating her sxs.  R/o H pylori.  Start Protonix.  Carafate for symptomatic relief.  Reviewed lifestyle and dietary modifications.  Pt expressed understanding and is in agreement w/ plan.

## 2016-01-15 NOTE — Progress Notes (Signed)
   Subjective:    Patient ID: Cody Hawkins, male    DOB: 05/02/1970, 45 y.o.   MRN: [id number]  HPI Chest discomfort- substernal, intermittent.  Occurring 3-4x/day.  sxs started ~2-3 weeks ago.  Pt had similar issues previously and improved w/ Prilosec.  These sxs feels similar but prilosec is not helping.  Having sensation of need to belch and sour brash.  Pain will occasionally radiate through to back but is most consistently a burning discomfort.  sxs woke him from sleep this AM at 3:30 and lasted for 30 minutes.  No sxs w/ exertion.  No relief w/ GI cocktail in office.  No TTP over chest wall.  Pt reports 'a ton of stress at work'.  Pt notes that chest discomfort will worsen in times of stress and can be associated w/ some lightheadedness.  Pt increased his Omeprazole back to 40mg  this week w/o improvement.  No improvement w/ Baking Soda.   Review of Systems For ROS see HPI     Objective:   Physical Exam  Constitutional: He is oriented to person, place, and time. He appears well-developed and well-nourished. No distress.  HENT:  Head: Normocephalic and atraumatic.  Eyes: Conjunctivae and EOM are normal. Pupils are equal, round, and reactive to light.  Neck: Normal range of motion. Neck supple. No thyromegaly present.  Cardiovascular: Normal rate, regular rhythm, normal heart sounds and intact distal pulses.   No murmur heard. Pulmonary/Chest: Effort normal and breath sounds normal. No respiratory distress. He exhibits no tenderness.  Abdominal: Soft. Bowel sounds are normal. He exhibits no distension.  Musculoskeletal: He exhibits no edema.  Lymphadenopathy:    He has no cervical adenopathy.  Neurological: He is alert and oriented to person, place, and time. No cranial nerve deficit.  Skin: Skin is warm and dry.  Psychiatric: He has a normal mood and affect. His behavior is normal.  Vitals reviewed.         Assessment & Plan:

## 2016-01-15 NOTE — Patient Instructions (Addendum)
Follow up by phone or MyChart in 2 weeks to let me know how you're doing Start the Protonix once daily Take the Carafate prior to meals and before bed to protect the stomach Try and limit caffeine, alcohol, spicy or acidic food as this may worsen your symptoms Call with any questions or concerns- particularly if anything changes or worsens Hang in there! Have a great weekend!!!

## 2016-01-16 LAB — H. PYLORI ANTIBODY, IGG

## 2016-02-22 ENCOUNTER — Other Ambulatory Visit: Payer: Self-pay | Admitting: Family Medicine

## 2016-04-08 DIAGNOSIS — J029 Acute pharyngitis, unspecified: Secondary | ICD-10-CM | POA: Insufficient documentation

## 2016-05-03 ENCOUNTER — Other Ambulatory Visit: Payer: Self-pay | Admitting: Family Medicine

## 2016-07-18 ENCOUNTER — Ambulatory Visit (INDEPENDENT_AMBULATORY_CARE_PROVIDER_SITE_OTHER): Payer: BC Managed Care – PPO | Admitting: Family Medicine

## 2016-07-18 ENCOUNTER — Encounter: Payer: Self-pay | Admitting: Family Medicine

## 2016-07-18 VITALS — BP 110/73 | HR 70 | Temp 98.3°F | Resp 16 | Ht 72.0 in | Wt 167.2 lb

## 2016-07-18 DIAGNOSIS — E785 Hyperlipidemia, unspecified: Secondary | ICD-10-CM | POA: Insufficient documentation

## 2016-07-18 DIAGNOSIS — Z0000 Encounter for general adult medical examination without abnormal findings: Secondary | ICD-10-CM | POA: Diagnosis not present

## 2016-07-18 LAB — CBC WITH DIFFERENTIAL/PLATELET
Basophils Absolute: 0.1 10*3/uL (ref 0.0–0.1)
Basophils Relative: 0.7 % (ref 0.0–3.0)
EOS PCT: 2.8 % (ref 0.0–5.0)
Eosinophils Absolute: 0.2 10*3/uL (ref 0.0–0.7)
HCT: 47.1 % (ref 39.0–52.0)
HEMOGLOBIN: 16 g/dL (ref 13.0–17.0)
LYMPHS ABS: 2.5 10*3/uL (ref 0.7–4.0)
LYMPHS PCT: 32.9 % (ref 12.0–46.0)
MCHC: 33.9 g/dL (ref 30.0–36.0)
MCV: 88.4 fl (ref 78.0–100.0)
MONOS PCT: 5.5 % (ref 3.0–12.0)
Monocytes Absolute: 0.4 10*3/uL (ref 0.1–1.0)
NEUTROS PCT: 58.1 % (ref 43.0–77.0)
Neutro Abs: 4.4 10*3/uL (ref 1.4–7.7)
Platelets: 230 10*3/uL (ref 150.0–400.0)
RBC: 5.32 Mil/uL (ref 4.22–5.81)
RDW: 13.2 % (ref 11.5–15.5)
WBC: 7.6 10*3/uL (ref 4.0–10.5)

## 2016-07-18 LAB — HEPATIC FUNCTION PANEL
ALBUMIN: 4.7 g/dL (ref 3.5–5.2)
ALK PHOS: 74 U/L (ref 39–117)
ALT: 24 U/L (ref 0–53)
AST: 56 U/L — AB (ref 0–37)
BILIRUBIN DIRECT: 0.1 mg/dL (ref 0.0–0.3)
TOTAL PROTEIN: 6.9 g/dL (ref 6.0–8.3)
Total Bilirubin: 0.5 mg/dL (ref 0.2–1.2)

## 2016-07-18 LAB — LIPID PANEL
CHOLESTEROL: 197 mg/dL (ref 0–200)
HDL: 52.3 mg/dL (ref >39.00)
LDL Cholesterol: 128 mg/dL — ABNORMAL HIGH (ref 0–99)
NonHDL: 144.44
TRIGLYCERIDES: 82 mg/dL (ref 0.0–149.0)
Total CHOL/HDL Ratio: 4
VLDL: 16.4 mg/dL (ref 0.0–40.0)

## 2016-07-18 LAB — BASIC METABOLIC PANEL
BUN: 18 mg/dL (ref 6–23)
CHLORIDE: 103 meq/L (ref 96–112)
CO2: 31 mEq/L (ref 19–32)
Calcium: 9.8 mg/dL (ref 8.4–10.5)
Creatinine, Ser: 0.93 mg/dL (ref 0.40–1.50)
GFR: 92.5 mL/min (ref >60.00)
GLUCOSE: 79 mg/dL (ref 70–99)
POTASSIUM: 3.9 meq/L (ref 3.5–5.1)
SODIUM: 142 meq/L (ref 135–145)

## 2016-07-18 LAB — PSA: PSA: 1.03 ng/mL (ref 0.10–4.00)

## 2016-07-18 LAB — TSH: TSH: 1.29 u[IU]/mL (ref 0.35–4.50)

## 2016-07-18 MED ORDER — PANTOPRAZOLE SODIUM 40 MG PO TBEC: DELAYED_RELEASE_TABLET | ORAL | 1 refills | Status: DC

## 2016-07-18 MED ORDER — BUPROPION HCL ER (XL) 150 MG PO TB24: 150.0000 mg | ORAL_TABLET | Freq: Every day | ORAL | 1 refills | Status: DC

## 2016-07-18 NOTE — Progress Notes (Signed)
   Subjective:    Patient ID: Cody Hawkins, male    DOB: 1970/10/19, 46 y.o.   MRN: [id number]  HPI CPE- UTD on Tdap.  Too young for colonoscopy.  Lost 13 lbs since last visit.  Not taking Lipitor   Review of Systems Patient reports no vision/hearing changes, anorexia, fever ,adenopathy, persistant/recurrent hoarseness, swallowing issues, chest pain, palpitations, edema, persistant/recurrent cough, hemoptysis, dyspnea (rest,exertional, paroxysmal nocturnal), gastrointestinal  bleeding (melena, rectal bleeding), abdominal pain, excessive heart burn, GU symptoms (dysuria, hematuria, voiding/incontinence issues) syncope, focal weakness, memory loss, numbness & tingling, skin/hair/nail changes, depression, anxiety, abnormal bruising/bleeding, musculoskeletal symptoms/signs.     Objective:   Physical Exam BP 110/73   Pulse 70   Temp 98.3 F (36.8 C) (Oral)   Resp 16   Ht 6' (1.829 m)   Wt 167 lb 4 oz (75.9 kg)   SpO2 98%   BMI 22.68 kg/m   General Appearance:    Alert, cooperative, no distress, appears stated age  Head:    Normocephalic, without obvious abnormality, atraumatic  Eyes:    PERRL, conjunctiva/corneas clear, EOM's intact, fundi    benign, both eyes       Ears:    Normal TM's and external ear canals, both ears  Nose:   Nares normal, septum midline, mucosa normal, no drainage   or sinus tenderness  Throat:   Lips, mucosa, and tongue normal; teeth and gums normal  Neck:   Supple, symmetrical, trachea midline, no adenopathy;       thyroid:  No enlargement/tenderness/nodules  Back:     Symmetric, no curvature, ROM normal, no CVA tenderness  Lungs:     Clear to auscultation bilaterally, respirations unlabored  Chest wall:    No tenderness or deformity  Heart:    Regular rate and rhythm, S1 and S2 normal, no murmur, rub   or gallop  Abdomen:     Soft, non-tender, bowel sounds active all four quadrants,    no masses, no organomegaly  Genitalia:    Normal male without  lesion, discharge or tenderness  Rectal:    Normal tone, normal prostate, no masses or tenderness  Extremities:   Extremities normal, atraumatic, no cyanosis or edema  Pulses:   2+ and symmetric all extremities  Skin:   Skin color, texture, turgor normal, no rashes or lesions  Lymph nodes:   Cervical, supraclavicular, and axillary nodes normal  Neurologic:   CNII-XII intact. Normal strength, sensation and reflexes      throughout          Assessment & Plan:

## 2016-07-18 NOTE — Assessment & Plan Note (Signed)
Pt's PE WNL.  UTD on Tdap.  Check labs.  Anticipatory guidance provided.  

## 2016-07-18 NOTE — Assessment & Plan Note (Signed)
Chronic problem.  Not currently on Lipitor.  Check labs.  Restart meds prn.

## 2016-07-18 NOTE — Patient Instructions (Signed)
Follow up in 6 months to recheck cholesterol We'll notify you of your lab results and make any changes if needed Continue to work on healthy diet and regular exercise- you look great! Call with any questions or concerns Happy Spring!!! 

## 2016-07-18 NOTE — Progress Notes (Signed)
Pre visit review using our clinic review tool, if applicable. No additional management support is needed unless otherwise documented below in the visit note. 

## 2016-07-19 NOTE — Progress Notes (Signed)
Called pt and lmovm to return call.

## 2016-07-21 ENCOUNTER — Other Ambulatory Visit: Payer: Self-pay | Admitting: Family Medicine

## 2016-07-21 DIAGNOSIS — R7989 Other specified abnormal findings of blood chemistry: Secondary | ICD-10-CM

## 2016-07-21 DIAGNOSIS — R945 Abnormal results of liver function studies: Secondary | ICD-10-CM

## 2016-12-31 ENCOUNTER — Encounter (HOSPITAL_COMMUNITY): Payer: Self-pay

## 2016-12-31 ENCOUNTER — Emergency Department (HOSPITAL_COMMUNITY): Payer: BC Managed Care – PPO

## 2016-12-31 DIAGNOSIS — Z5321 Procedure and treatment not carried out due to patient leaving prior to being seen by health care provider: Secondary | ICD-10-CM | POA: Diagnosis not present

## 2016-12-31 DIAGNOSIS — R61 Generalized hyperhidrosis: Secondary | ICD-10-CM | POA: Diagnosis not present

## 2016-12-31 DIAGNOSIS — R0789 Other chest pain: Secondary | ICD-10-CM | POA: Diagnosis present

## 2016-12-31 LAB — BASIC METABOLIC PANEL
Anion gap: 10 (ref 5–15)
BUN: 14 mg/dL (ref 6–20)
CALCIUM: 9.3 mg/dL (ref 8.9–10.3)
CO2: 27 mmol/L (ref 22–32)
Chloride: 103 mmol/L (ref 101–111)
Creatinine, Ser: 1.04 mg/dL (ref 0.61–1.24)
GFR calc Af Amer: >60 mL/min (ref >60)
GLUCOSE: 89 mg/dL (ref 65–99)
Potassium: 4.1 mmol/L (ref 3.5–5.1)
Sodium: 140 mmol/L (ref 135–145)

## 2016-12-31 LAB — CBC
HEMATOCRIT: 44.5 % (ref 39.0–52.0)
HEMOGLOBIN: 15.5 g/dL (ref 13.0–17.0)
MCH: 29.9 pg (ref 26.0–34.0)
MCHC: 34.8 g/dL (ref 30.0–36.0)
MCV: 85.9 fL (ref 78.0–100.0)
Platelets: 220 10*3/uL (ref 150–400)
RBC: 5.18 MIL/uL (ref 4.22–5.81)
RDW: 12.4 % (ref 11.5–15.5)
WBC: 7.4 10*3/uL (ref 4.0–10.5)

## 2016-12-31 LAB — I-STAT TROPONIN, ED: Troponin i, poc: 0 ng/mL (ref 0.00–0.08)

## 2016-12-31 NOTE — ED Triage Notes (Signed)
Pt. Reports  Having left chest pain that radiates to his back. Pt. Reports having pain for a couple weeks. Pt. Reports it mainly happens after he eats. Pt. Reports diaphoresis. Pt. Denies nausea and/ or vomiting. Pt. Denies radiating pain to one side.

## 2017-01-01 ENCOUNTER — Emergency Department (HOSPITAL_COMMUNITY)
Admission: EM | Admit: 2017-01-01 | Discharge: 2017-01-01 | Disposition: A | Discharge status: Home / Self Care | Payer: BC Managed Care – PPO | Attending: Emergency Medicine | Admitting: Emergency Medicine

## 2017-01-01 LAB — HEPATIC FUNCTION PANEL
ALT: 27 U/L (ref 17–63)
AST: 28 U/L (ref 15–41)
Albumin: 4.3 g/dL (ref 3.5–5.0)
Alkaline Phosphatase: 73 U/L (ref 38–126)
BILIRUBIN INDIRECT: 0.5 mg/dL (ref 0.3–0.9)
Bilirubin, Direct: 0.2 mg/dL (ref 0.1–0.5)
TOTAL PROTEIN: 6.5 g/dL (ref 6.5–8.1)
Total Bilirubin: 0.7 mg/dL (ref 0.3–1.2)

## 2017-01-01 LAB — LIPASE, BLOOD: Lipase: 34 U/L (ref 11–51)

## 2017-01-01 NOTE — ED Notes (Signed)
Called for room X2 with no answer 

## 2017-01-02 ENCOUNTER — Other Ambulatory Visit: Payer: Self-pay | Admitting: Family Medicine

## 2017-01-02 ENCOUNTER — Telehealth: Payer: Self-pay

## 2017-01-02 NOTE — Telephone Encounter (Signed)
LM requesting call back regarding ED visit and symptoms.

## 2017-01-17 ENCOUNTER — Ambulatory Visit (INDEPENDENT_AMBULATORY_CARE_PROVIDER_SITE_OTHER): Payer: BC Managed Care – PPO | Admitting: Family Medicine

## 2017-01-17 ENCOUNTER — Encounter: Payer: Self-pay | Admitting: Family Medicine

## 2017-01-17 VITALS — BP 121/80 | HR 88 | Temp 98.0°F | Resp 16 | Ht 72.0 in | Wt 174.2 lb

## 2017-01-17 DIAGNOSIS — F419 Anxiety disorder, unspecified: Secondary | ICD-10-CM

## 2017-01-17 DIAGNOSIS — F32A Depression, unspecified: Secondary | ICD-10-CM

## 2017-01-17 DIAGNOSIS — M62838 Other muscle spasm: Secondary | ICD-10-CM | POA: Diagnosis not present

## 2017-01-17 DIAGNOSIS — F329 Major depressive disorder, single episode, unspecified: Secondary | ICD-10-CM | POA: Diagnosis not present

## 2017-01-17 DIAGNOSIS — E785 Hyperlipidemia, unspecified: Secondary | ICD-10-CM

## 2017-01-17 LAB — TSH: TSH: 1.2 u[IU]/mL (ref 0.35–4.50)

## 2017-01-17 LAB — LIPID PANEL
CHOL/HDL RATIO: 4
CHOLESTEROL: 209 mg/dL — AB (ref 0–200)
HDL: 48.2 mg/dL (ref >39.00)
LDL CALC: 139 mg/dL — AB (ref 0–99)
NonHDL: 160.59
TRIGLYCERIDES: 110 mg/dL (ref 0.0–149.0)
VLDL: 22 mg/dL (ref 0.0–40.0)

## 2017-01-17 MED ORDER — BUPROPION HCL ER (XL) 300 MG PO TB24: 300.0000 mg | ORAL_TABLET | Freq: Every day | ORAL | 3 refills | Status: DC

## 2017-01-17 MED ORDER — CYCLOBENZAPRINE HCL 10 MG PO TABS: 10.0000 mg | ORAL_TABLET | Freq: Three times a day (TID) | ORAL | 0 refills | Status: DC | PRN

## 2017-01-17 NOTE — Assessment & Plan Note (Signed)
Chronic problem.  Pt stopped his statin w/o direction- no reason given.  Check labs.  Restart meds prn.  Pt expressed understanding and is in agreement w/ plan.

## 2017-01-17 NOTE — Assessment & Plan Note (Signed)
Deteriorated.  Pt is under considerable stress at work and admits that his cluster of sxs may be stress related- trap spasm, numbness of face.  He doesn't have chest pain w/ exertion.  Will increase the Wellbutrin to 300mg  daily.  Reviewed his recent labs, EKG, CXR- no mediastinal widening to suggest aortic aneurysm, and stress test from 2 yrs ago which was low risk.  Reassurance provided.  Reviewed supportive care and red flags that should prompt return.  Pt expressed understanding and is in agreement w/ plan.

## 2017-01-17 NOTE — Patient Instructions (Signed)
Follow up in 1 month to recheck stress levels We'll notify you of your lab results and make any changes if needed Continue to work on healthy diet and regular exercise- this is a great stress outlet! Use the Flexeril at night for muscle spasm- may cause drowsiness Increase the Wellbutrin to 300mg  daily (2 of what you have at home and 1 of the new prescription) Call with any questions or concerns- particularly if symptoms change or worsen Hang in there!!!

## 2017-01-17 NOTE — Progress Notes (Signed)
   Subjective:    Patient ID: Cody Hawkins, male    DOB: 08/18/1970, 46 y.o.   MRN: [id number]  HPI Hyperlipidemia- chronic problem.  Pt stopped his Lipitor.  Chest pain- was seen in ER 10/13 but left before MD evaluation.  EKG WNL, Trop WNL.  Pt describes them as 'attacks'- 'i feel fine and then I feel terrible'.  Will occur 'out of the blue'.  Pt reports associated tension and aching in his neck when it occurs, sinus pressure and mild numbness of R side of face.  Afterwards will be 'exhausted'.  sxs are occurring 'at least once a day' and are 'debilitating'.  Pt will walk daily on the treadmill for 100 minutes at 4 mph and has no sxs.  'I'm stressed out all the time'.  Had stress test 2 yrs ago- WNL.  CXR 10/13 WNL- no evidence of mediastinal widening.   Review of Systems For ROS see HPI     Objective:   Physical Exam  Constitutional: He is oriented to person, place, and time. He appears well-developed and well-nourished. No distress.  HENT:  Head: Normocephalic and atraumatic.  Eyes: Pupils are equal, round, and reactive to light. Conjunctivae and EOM are normal.  Neck: Normal range of motion. Neck supple. No thyromegaly present.  Cardiovascular: Normal rate, regular rhythm, normal heart sounds and intact distal pulses.   No murmur heard. Pulmonary/Chest: Effort normal and breath sounds normal. No respiratory distress.  Abdominal: Soft. Bowel sounds are normal. He exhibits no distension.  Musculoskeletal: He exhibits no edema.  Bilateral trap spasm  Lymphadenopathy:    He has no cervical adenopathy.  Neurological: He is alert and oriented to person, place, and time. No cranial nerve deficit.  Skin: Skin is warm and dry.  Psychiatric: He has a normal mood and affect. His behavior is normal.  Vitals reviewed.         Assessment & Plan:

## 2017-01-18 ENCOUNTER — Encounter: Payer: Self-pay | Admitting: General Practice

## 2017-02-17 ENCOUNTER — Other Ambulatory Visit: Payer: Self-pay | Admitting: Family Medicine

## 2017-04-01 ENCOUNTER — Other Ambulatory Visit: Payer: Self-pay | Admitting: Family Medicine

## 2017-05-01 ENCOUNTER — Other Ambulatory Visit: Payer: Self-pay | Admitting: Family Medicine

## 2017-07-16 ENCOUNTER — Other Ambulatory Visit: Payer: Self-pay | Admitting: Family Medicine

## 2017-07-18 ENCOUNTER — Other Ambulatory Visit: Payer: Self-pay | Admitting: Family Medicine

## 2017-07-19 ENCOUNTER — Telehealth: Payer: Self-pay

## 2017-07-19 NOTE — Telephone Encounter (Signed)
Will refill x1 month but he will need to schedule cholesterol appt or CPE if due

## 2017-07-19 NOTE — Telephone Encounter (Signed)
Called patient and left a detailed voice message on answering machine for patient to call our office to schedule an appointment for his yearly physical for [place] to continue his current medications.  CRM Created.

## 2017-07-19 NOTE — Telephone Encounter (Signed)
Last OV 01/17/18, No Future OV  Last filled 02/17/17, # 30 with 0 refills  Still okay to refill? Please advise.

## 2017-07-19 NOTE — Telephone Encounter (Signed)
Called patient and left a detailed voice message for patient to call us back to schedule a physical with [place].  CRM Created.

## 2017-07-24 ENCOUNTER — Emergency Department (HOSPITAL_COMMUNITY)
Admission: EM | Admit: 2017-07-24 | Discharge: 2017-07-25 | Disposition: A | Discharge status: Home / Self Care | Payer: BC Managed Care – PPO | Attending: Emergency Medicine | Admitting: Emergency Medicine

## 2017-07-24 ENCOUNTER — Other Ambulatory Visit: Payer: Self-pay

## 2017-07-24 ENCOUNTER — Emergency Department (HOSPITAL_COMMUNITY): Payer: BC Managed Care – PPO

## 2017-07-24 DIAGNOSIS — S61213A Laceration without foreign body of left middle finger without damage to nail, initial encounter: Secondary | ICD-10-CM | POA: Insufficient documentation

## 2017-07-24 DIAGNOSIS — Y999 Unspecified external cause status: Secondary | ICD-10-CM | POA: Diagnosis not present

## 2017-07-24 DIAGNOSIS — Y929 Unspecified place or not applicable: Secondary | ICD-10-CM | POA: Insufficient documentation

## 2017-07-24 DIAGNOSIS — S61211A Laceration without foreign body of left index finger without damage to nail, initial encounter: Secondary | ICD-10-CM

## 2017-07-24 DIAGNOSIS — Z23 Encounter for immunization: Secondary | ICD-10-CM | POA: Insufficient documentation

## 2017-07-24 DIAGNOSIS — Z87891 Personal history of nicotine dependence: Secondary | ICD-10-CM | POA: Diagnosis not present

## 2017-07-24 DIAGNOSIS — Z79899 Other long term (current) drug therapy: Secondary | ICD-10-CM | POA: Insufficient documentation

## 2017-07-24 DIAGNOSIS — Y93H2 Activity, gardening and landscaping: Secondary | ICD-10-CM | POA: Diagnosis not present

## 2017-07-24 DIAGNOSIS — W293XXA Contact with powered garden and outdoor hand tools and machinery, initial encounter: Secondary | ICD-10-CM | POA: Diagnosis not present

## 2017-07-24 DIAGNOSIS — S68621A Partial traumatic transphalangeal amputation of left index finger, initial encounter: Secondary | ICD-10-CM | POA: Insufficient documentation

## 2017-07-24 DIAGNOSIS — S61311A Laceration without foreign body of left index finger with damage to nail, initial encounter: Secondary | ICD-10-CM | POA: Diagnosis not present

## 2017-07-24 MED ORDER — ACETAMINOPHEN 325 MG PO TABS: 650.0000 mg | ORAL_TABLET | Freq: Four times a day (QID) | ORAL | Status: DC

## 2017-07-24 MED ORDER — CEPHALEXIN 500 MG PO CAPS: 500.0000 mg | ORAL_CAPSULE | Freq: Four times a day (QID) | ORAL | 0 refills | Status: AC

## 2017-07-24 MED ORDER — TETANUS-DIPHTH-ACELL PERTUSSIS 5-2.5-18.5 LF-MCG/0.5 IM SUSP
0.5000 mL | Freq: Once | INTRAMUSCULAR | Status: AC
  Administered 2017-07-24: 0.5 mL via INTRAMUSCULAR
  Filled 2017-07-24: qty 0.5

## 2017-07-24 MED ORDER — LIDOCAINE-EPINEPHRINE 1 %-1:100000 IJ SOLN
20.0000 mL | Freq: Once | INTRAMUSCULAR | Status: AC
  Administered 2017-07-24: 20 mL via INTRADERMAL

## 2017-07-24 MED ORDER — CEFAZOLIN SODIUM-DEXTROSE 2-4 GM/100ML-% IV SOLN
2.0000 g | Freq: Once | INTRAVENOUS | Status: AC
  Administered 2017-07-24: 2 g via INTRAVENOUS
  Filled 2017-07-24: qty 100

## 2017-07-24 MED ORDER — OXYCODONE-ACETAMINOPHEN 5-325 MG PO TABS: 1.0000 | ORAL_TABLET | Freq: Once | ORAL | Status: DC

## 2017-07-24 MED ORDER — IBUPROFEN 200 MG PO TABS: 600.0000 mg | ORAL_TABLET | Freq: Four times a day (QID) | ORAL | Status: DC

## 2017-07-24 MED ORDER — BUPIVACAINE HCL (PF) 0.5 % IJ SOLN
10.0000 mL | Freq: Once | INTRAMUSCULAR | Status: AC
  Administered 2017-07-24: 10 mL

## 2017-07-24 MED ORDER — OXYCODONE HCL 5 MG PO TABS: 5.0000 mg | ORAL_TABLET | Freq: Four times a day (QID) | ORAL | 0 refills | Status: DC | PRN

## 2017-07-24 NOTE — ED Provider Notes (Addendum)
[facility] EMERGENCY DEPARTMENT Provider Note   CSN: [id number] Arrival date & time: 07/24/17  2039     History   Chief Complaint Chief Complaint  Patient presents with  . Laceration    HPI Cody Hawkins is a 47 y.o. male who presents to ED for evaluation of nondominant left index and middle finger laceration that occurred prior to arrival.  He was using electric hedge tremors when he accidentally lacerated his finger.  Denies any prior fracture, dislocations or procedures in the area.  Denies any blood thinner use.  Unsure of last tetanus.  HPI  Past Medical History:  Diagnosis Date  . Allergy   . Anxiety   . Chicken pox   . Environmental allergies   . Fatigue   . H/O vasectomy   . Hyperlipidemia     Patient Active Problem List   Diagnosis Date Noted  . Hyperlipidemia 07/18/2016  . GERD (gastroesophageal reflux disease) 07/25/2014  . Anxiety and depression 08/20/2012  . Decreased libido 08/20/2012  . General medical examination 07/04/2011  . Palpitations 06/28/2011  . Chest pain 06/28/2011    Past Surgical History:  Procedure Laterality Date  . BONE CYST EXCISION  1983        Home Medications    Prior to Admission medications   Medication Sig Start Date End Date Taking? Authorizing Provider  acetaminophen (TYLENOL) 325 MG tablet Take 2 tablets (650 mg total) by mouth every 6 (six) hours. 07/24/17   Mack Hook, MD  atorvastatin (LIPITOR) 20 MG tablet Take 1 tablet (20 mg total) by mouth daily. Patient not taking: Reported on 01/15/2016 07/17/15   Sheliah Hatch, MD  buPROPion (WELLBUTRIN XL) 300 MG 24 hr tablet TAKE 1 TABLET(300 MG) BY MOUTH DAILY. 07/17/17   Sheliah Hatch, MD  cephALEXin (KEFLEX) 500 MG capsule Take 1 capsule (500 mg total) by mouth 4 (four) times daily for 7 days. 07/24/17 07/31/17  Mack Hook, MD  cyclobenzaprine (FLEXERIL) 10 MG tablet TAKE 1 TABLET(10 MG) BY MOUTH THREE TIMES DAILY AS NEEDED FOR  MUSCLE SPASMS 07/19/17   Sheliah Hatch, MD  ibuprofen (ADVIL) 200 MG tablet Take 3 tablets (600 mg total) by mouth every 6 (six) hours. 07/24/17   Mack Hook, MD  oxyCODONE (ROXICODONE) 5 MG immediate release tablet Take 1-2 tablets (5-10 mg total) by mouth every 6 (six) hours as needed for severe pain. 07/24/17   Mack Hook, MD  pantoprazole (PROTONIX) 40 MG tablet TAKE 1 TABLET(40 MG) BY MOUTH DAILY 07/18/16   Sheliah Hatch, MD    Family History Family History  Problem Relation Age of Onset  . Arthritis Mother   . Hyperlipidemia Mother   . Heart disease Mother   . Diabetes Maternal Grandmother   . Cancer Maternal Grandfather   . Stroke Maternal Grandfather     Social History Social History   Tobacco Use  . Smoking status: Former Smoker    Last attempt to quit: 06/20/2007    Years since quitting: 10.1  . Smokeless tobacco: Never Used  Substance Use Topics  . Alcohol use: Yes    Comment: social  . Drug use: No     Allergies   Tetracyclines & related   Review of Systems Review of Systems  Constitutional: Negative for appetite change, chills and fever.  HENT: Negative for ear pain, rhinorrhea, sneezing and sore throat.   Eyes: Negative for photophobia and visual disturbance.  Respiratory: Negative for cough, chest tightness, shortness  of breath and wheezing.   Cardiovascular: Negative for chest pain and palpitations.  Gastrointestinal: Negative for abdominal pain, blood in stool, constipation, diarrhea, nausea and vomiting.  Genitourinary: Negative for dysuria, hematuria and urgency.  Musculoskeletal: Negative for myalgias.  Skin: Positive for wound. Negative for rash.  Neurological: Negative for dizziness, weakness and light-headedness.     Physical Exam Updated Vital Signs BP 132/88   Pulse 90   Temp 98 F (36.7 C) (Oral)   Ht 6' (1.829 m)   Wt 83.9 kg (185 lb)   SpO2 98%   BMI 25.09 kg/m   Physical Exam  Constitutional: He appears  well-developed and well-nourished. No distress.  HENT:  Head: Normocephalic and atraumatic.  Nose: Nose normal.  Eyes: Conjunctivae and EOM are normal. Left eye exhibits no discharge. No scleral icterus.  Neck: Normal range of motion. Neck supple.  Cardiovascular: Normal rate, regular rhythm, normal heart sounds and intact distal pulses. Exam reveals no gallop and no friction rub.  No murmur heard. Pulmonary/Chest: Effort normal and breath sounds normal. No respiratory distress.  Abdominal: Soft. Bowel sounds are normal. He exhibits no distension. There is no tenderness. There is no guarding.  Musculoskeletal: Normal range of motion. He exhibits no edema.  Neurological: He is alert. He exhibits normal muscle tone. Coordination normal.  Skin: Skin is warm and dry. Laceration noted. No rash noted.  Left index and middle finger laceration noted.  Psychiatric: He has a normal mood and affect.  Nursing note and vitals reviewed.    ED Treatments / Results  Labs (all labs ordered are listed, but only abnormal results are displayed) Labs Reviewed - No data to display  EKG None  Radiology Dg Finger Index Left  Result Date: 07/24/2017 CLINICAL DATA:  Laceration to distal left index finger from heads clippers. EXAM: LEFT INDEX FINGER 2+V COMPARISON:  None. FINDINGS: There is a fracture across the mid to distal shaft of the distal phalanx of the index finger, mildly displaced in a distal, ulnar direction by 2 mm. There is an associated soft tissue laceration. The distal fracture component is angulated in an ulnar direction by approximately 18 degrees. No other fractures.  The joints are normally spaced and aligned. No radiopaque foreign bodies. IMPRESSION: 1. Fracture of the distal phalanx of the left index finger associated with a soft tissue laceration. 2. No radiopaque foreign body.  No dislocation. Electronically Signed   By: Amie Portland M.D.   On: 07/24/2017 21:49    Procedures Procedures  (including critical care time)  Medications Ordered in ED Medications  ceFAZolin (ANCEF) IVPB 2g/100 mL premix (2 g Intravenous New Bag/Given 07/24/17 2330)  oxyCODONE-acetaminophen (PERCOCET/ROXICET) 5-325 MG per tablet 1 tablet (has no administration in time range)  bupivacaine (MARCAINE) 0.5 % injection 10 mL (10 mLs Infiltration Given by Other 07/24/17 2218)  lidocaine-EPINEPHrine (XYLOCAINE W/EPI) 1 %-1:100000 (with pres) injection 20 mL (20 mLs Intradermal Given by Other 07/24/17 2218)  Tdap (BOOSTRIX) injection 0.5 mL (0.5 mLs Intramuscular Given 07/24/17 2334)     Initial Impression / Assessment and Plan / ED Course  I have reviewed the triage vital signs and the nursing notes.  Pertinent labs & imaging results that were available during my care of the patient were reviewed by me and considered in my medical decision making (see chart for details).     Patient presents to ED for evaluation of left index and middle finger lacerations that occurred prior to arrival.  Patient is unsure of last  tetanus shot.  Patient was evaluated and treated by hand specialist, Dr. Janee Morn.  Will give Ancef, updated tetanus.  Advised to follow-up with orthopedist office and appropriate time.  Will send home with remainder of antibiotic course, pain medication and wound care. Please see Dr. Carollee Massed note for further details.  Portions of this note were generated with Scientist, clinical (histocompatibility and immunogenetics). Dictation errors may occur despite best attempts at proofreading.   Final Clinical Impressions(s) / ED Diagnoses   Final diagnoses:  Laceration of left index finger without foreign body, nail damage status unspecified, initial encounter    ED Discharge Orders        Ordered    acetaminophen (TYLENOL) 325 MG tablet  Every 6 hours     07/24/17 2319    ibuprofen (ADVIL) 200 MG tablet  Every 6 hours     07/24/17 2319    cephALEXin (KEFLEX) 500 MG capsule  4 times daily     07/24/17 2319    oxyCODONE  (ROXICODONE) 5 MG immediate release tablet  Every 6 hours PRN     07/24/17 2319          Dietrich Pates, PA-C 07/25/17 0028    Jacalyn Lefevre, MD 07/25/17 1646

## 2017-07-24 NOTE — ED Triage Notes (Signed)
Patient was cutting brush with an Publishing rights manager when he injured his finger (left hand, second digit); EMS states would be partial amputation.

## 2017-07-24 NOTE — Consult Note (Signed)
ORTHOPAEDIC CONSULTATION HISTORY & PHYSICAL REQUESTING PHYSICIAN: Jacalyn Lefevre, MD  Chief Complaint: Left hand injury to multiple digits  HPI: Cody Hawkins is a 47 y.o. male who injured his left index and long finger using electric hedge tremors.  He presented to the emergency department for evaluation.  X-rays have been obtained.  Tetanus is not yet been updated nor has he received antibiotics.  Past Medical History:  Diagnosis Date  . Allergy   . Anxiety   . Chicken pox   . Environmental allergies   . Fatigue   . H/O vasectomy   . Hyperlipidemia    Past Surgical History:  Procedure Laterality Date  . BONE CYST EXCISION  1983   Social History   Socioeconomic History  . Marital status: Married    Spouse name: Not on file  . Number of children: Not on file  . Years of education: Not on file  . Highest education level: Not on file  Occupational History  . Not on file  Social Needs  . Financial resource strain: Not on file  . Food insecurity:    Worry: Not on file    Inability: Not on file  . Transportation needs:    Medical: Not on file    Non-medical: Not on file  Tobacco Use  . Smoking status: Former Smoker    Last attempt to quit: 06/20/2007    Years since quitting: 10.1  . Smokeless tobacco: Never Used  Substance and Sexual Activity  . Alcohol use: Yes    Comment: social  . Drug use: No  . Sexual activity: Not on file  Lifestyle  . Physical activity:    Days per week: Not on file    Minutes per session: Not on file  . Stress: Not on file  Relationships  . Social connections:    Talks on phone: Not on file    Gets together: Not on file    Attends religious service: Not on file    Active member of club or organization: Not on file    Attends meetings of clubs or organizations: Not on file    Relationship status: Not on file  Other Topics Concern  . Not on file  Social History Narrative   Patient is married and lives with his wife and two   children.  He has a Master's degree in accounting.  He quit smoking six   months ago and uses beer on the weekends.  He denies a problem with   alcoholism or drug abuse.   Family History  Problem Relation Age of Onset  . Arthritis Mother   . Hyperlipidemia Mother   . Heart disease Mother   . Diabetes Maternal Grandmother   . Cancer Maternal Grandfather   . Stroke Maternal Grandfather    Allergies  Allergen Reactions  . Tetracyclines & Related Other (See Comments)    Pt states caused a strange odor come out of body    Prior to Admission medications   Medication Sig Start Date End Date Taking? Authorizing Provider  atorvastatin (LIPITOR) 20 MG tablet Take 1 tablet (20 mg total) by mouth daily. Patient not taking: Reported on 01/15/2016 07/17/15   Sheliah Hatch, MD  buPROPion (WELLBUTRIN XL) 300 MG 24 hr tablet TAKE 1 TABLET(300 MG) BY MOUTH DAILY. 07/17/17   Sheliah Hatch, MD  cyclobenzaprine (FLEXERIL) 10 MG tablet TAKE 1 TABLET(10 MG) BY MOUTH THREE TIMES DAILY AS NEEDED FOR MUSCLE SPASMS 07/19/17   Neena Rhymes  E, MD  pantoprazole (PROTONIX) 40 MG tablet TAKE 1 TABLET(40 MG) BY MOUTH DAILY 07/18/16   Sheliah Hatch, MD   Dg Finger Index Left  Result Date: 07/24/2017 CLINICAL DATA:  Laceration to distal left index finger from heads clippers. EXAM: LEFT INDEX FINGER 2+V COMPARISON:  None. FINDINGS: There is a fracture across the mid to distal shaft of the distal phalanx of the index finger, mildly displaced in a distal, ulnar direction by 2 mm. There is an associated soft tissue laceration. The distal fracture component is angulated in an ulnar direction by approximately 18 degrees. No other fractures.  The joints are normally spaced and aligned. No radiopaque foreign bodies. IMPRESSION: 1. Fracture of the distal phalanx of the left index finger associated with a soft tissue laceration. 2. No radiopaque foreign body.  No dislocation. Electronically Signed   By: Amie Portland M.D.   On: 07/24/2017 21:49    Positive ROS: All other systems have been reviewed and were otherwise negative with the exception of those mentioned in the HPI and as above.  Physical Exam: Vitals: Refer to EMR. Constitutional:  WD, WN, NAD HEENT:  NCAT, EOMI Neuro/Psych:  Alert & oriented to person, place, and time; appropriate mood & affect Lymphatic: No generalized extremity edema or lymphadenopathy Extremities / MSK:  The extremities are normal with respect to appearance, ranges of motion, joint stability, muscle strength/tone, sensation, & perfusion except as otherwise noted:  Left index finger is lacerated, coupled with a P3 fracture resulting in incomplete amputation of the tip.  Laceration starts dorsally wraps around the radial side to the mid pulp.  This resulted in an ulnar-based flap.  Distal piece bleeds and is pink.  It is through the distal aspect of the germinal matrix, nearly transverse in orientation, just slightly oblique.  Intact flexor and extensor tendons.  The skin laceration measures about 3 cm Left long finger has a similar set of lacerations that are full-thickness, resulting in proximally based flaps.  The 2 lacerations are parallel to one another measuring about 2.5 cm in total at the pulp radially  Assessment: 1.  Left long finger skin laceration 2.  Left index finger incomplete amputation resulting in injury to the nail plate, nailbed, skin, radial and volar soft tissues, and distal phalanx  Plan: I discussed these findings with him.  I recommended bedside repair and he consented.  Both digits were anesthetized with a digital block containing lidocaine and Marcaine and epinephrine.  Tourniquet was applied to the base of the index finger.  Both wounds were then scrubbed under running water in the sink.  The wounds were then prepped with Betadine and draped in usual sterile fashion.  The long finger was addressed first, with the lacerations were repaired,  resulting in simple repair of approximately 2.5 cm using 4-0 chromic suture.  Attention was shifted to the index finger, where the nail plate was removed, both proximal and distal to the laceration.  I then used 2 different 21-gauge hypodermic needles to affect reduction and fixation of the distal phalanx fracture.  Once the fracture was fixed, soft tissues were repaired.  6-0 chromic suture was used to reapproximate the nailbed and 4-0 chromic suture the remainder of the laceration which was somewhat complex and stellate.  This constituted intermediate repair of skin, 3 cm as well as nailbed repair.  The tourniquet was removed, both wounds were dressed, the index finger with a tongue blade splint immobilizing the DIP joint.  He will  receive a tetanus update in the ED and 2 g of Ancef.  Discharge instructions were reviewed and prescriptions rendered.  He will plan to follow-up in the office in roughly a week, with new IF xrays in the splint, and at which time he will need a follow-on appointment with hand therapy for splint fabrication.  Cliffton Asters Janee Morn, MD      Orthopaedic & Hand Surgery [facility] [address] [place], [place]  [address] Office: [phone] Mobile: [phone]  07/24/2017, 11:17 PM

## 2017-07-24 NOTE — ED Notes (Signed)
Hand MD at bedside for suture repair

## 2017-07-25 MED ORDER — OXYCODONE HCL 5 MG PO TABS
5.0000 mg | ORAL_TABLET | Freq: Once | ORAL | Status: AC
  Administered 2017-07-25: 5 mg via ORAL
  Filled 2017-07-25: qty 1

## 2017-07-25 MED ORDER — IBUPROFEN 200 MG PO TABS: 600.0000 mg | ORAL_TABLET | Freq: Four times a day (QID) | ORAL | 0 refills | Status: DC

## 2017-07-25 MED ORDER — ACETAMINOPHEN 325 MG PO TABS: 650.0000 mg | ORAL_TABLET | Freq: Four times a day (QID) | ORAL | 0 refills | Status: DC

## 2017-07-26 ENCOUNTER — Other Ambulatory Visit: Payer: Self-pay | Admitting: Family Medicine

## 2017-12-04 ENCOUNTER — Telehealth: Payer: Self-pay | Admitting: Emergency Medicine

## 2017-12-04 NOTE — Telephone Encounter (Signed)
I continue to have issues where I feel like my heart beat is out of rhythm. I had an EKG performed last Saturday night by EMS at my house and they said all looked okay but that I should follow up with my doctor or cardiologist. I still have some of the previous prescription (Flexaril) that seems to have helped in the past when I felt like I was having issues. I've had what I consider panic attacks in the past so I'm trying not to get too worked up about it but just want some reassurance.  Patient made an appt for 12/11/2017, should we work patient in sooner. Please advise.

## 2017-12-04 NOTE — Telephone Encounter (Signed)
LM to  RC, CRM Created  

## 2017-12-04 NOTE — Telephone Encounter (Signed)
If EKG was normal, it sounds like this is anxiety driven.  I am perfectly ok seeing him next week but if we would feel better being seen sooner, we can try and work him in

## 2017-12-05 NOTE — Telephone Encounter (Signed)
Patient informed. Agreed next week appointment would be good. Patient verbalized understanding  Kathi SimpersAmy Mariya Mottley,  LPN

## 2017-12-06 ENCOUNTER — Other Ambulatory Visit: Payer: Self-pay

## 2017-12-06 ENCOUNTER — Encounter (HOSPITAL_BASED_OUTPATIENT_CLINIC_OR_DEPARTMENT_OTHER): Payer: Self-pay

## 2017-12-06 ENCOUNTER — Emergency Department (HOSPITAL_BASED_OUTPATIENT_CLINIC_OR_DEPARTMENT_OTHER): Payer: BC Managed Care – PPO

## 2017-12-06 DIAGNOSIS — K219 Gastro-esophageal reflux disease without esophagitis: Secondary | ICD-10-CM | POA: Diagnosis not present

## 2017-12-06 DIAGNOSIS — Z79899 Other long term (current) drug therapy: Secondary | ICD-10-CM | POA: Insufficient documentation

## 2017-12-06 DIAGNOSIS — R079 Chest pain, unspecified: Secondary | ICD-10-CM | POA: Diagnosis not present

## 2017-12-06 DIAGNOSIS — Z87891 Personal history of nicotine dependence: Secondary | ICD-10-CM | POA: Insufficient documentation

## 2017-12-06 DIAGNOSIS — E785 Hyperlipidemia, unspecified: Secondary | ICD-10-CM | POA: Insufficient documentation

## 2017-12-06 LAB — CBC
HCT: 46.9 % (ref 39.0–52.0)
HEMOGLOBIN: 16.7 g/dL (ref 13.0–17.0)
MCH: 30.5 pg (ref 26.0–34.0)
MCHC: 35.6 g/dL (ref 30.0–36.0)
MCV: 85.6 fL (ref 78.0–100.0)
Platelets: 238 10*3/uL (ref 150–400)
RBC: 5.48 MIL/uL (ref 4.22–5.81)
RDW: 12.2 % (ref 11.5–15.5)
WBC: 8.4 10*3/uL (ref 4.0–10.5)

## 2017-12-06 NOTE — ED Triage Notes (Signed)
C/o CP x 1 week-NAD-steady gait 

## 2017-12-07 ENCOUNTER — Emergency Department (HOSPITAL_BASED_OUTPATIENT_CLINIC_OR_DEPARTMENT_OTHER)
Admission: EM | Admit: 2017-12-07 | Discharge: 2017-12-07 | Disposition: A | Discharge status: Home / Self Care | Payer: BC Managed Care – PPO | Attending: Emergency Medicine | Admitting: Emergency Medicine

## 2017-12-07 DIAGNOSIS — K219 Gastro-esophageal reflux disease without esophagitis: Secondary | ICD-10-CM

## 2017-12-07 DIAGNOSIS — R079 Chest pain, unspecified: Secondary | ICD-10-CM

## 2017-12-07 LAB — BASIC METABOLIC PANEL
ANION GAP: 10 (ref 5–15)
BUN: 15 mg/dL (ref 6–20)
CO2: 28 mmol/L (ref 22–32)
Calcium: 9.4 mg/dL (ref 8.9–10.3)
Chloride: 101 mmol/L (ref 98–111)
Creatinine, Ser: 1.07 mg/dL (ref 0.61–1.24)
Glucose, Bld: 105 mg/dL — ABNORMAL HIGH (ref 70–99)
Potassium: 4 mmol/L (ref 3.5–5.1)
Sodium: 139 mmol/L (ref 135–145)

## 2017-12-07 LAB — TROPONIN I

## 2017-12-07 MED ORDER — PANTOPRAZOLE SODIUM 40 MG PO TBEC: DELAYED_RELEASE_TABLET | ORAL | 1 refills | Status: DC

## 2017-12-07 MED ORDER — PANTOPRAZOLE SODIUM 40 MG PO TBEC
80.0000 mg | DELAYED_RELEASE_TABLET | Freq: Once | ORAL | Status: AC
  Administered 2017-12-07: 80 mg via ORAL
  Filled 2017-12-07: qty 2

## 2017-12-07 NOTE — ED Provider Notes (Signed)
[facility] Provider Note: Cody DellJ. Lane Jacobe Study, MD, FACEP  CSN: [id number] MRN: [id number] ARRIVAL: 12/06/17 at 2317 ROOM: [facility]08/[facility]08   CHIEF COMPLAINT  Chest Pain   HISTORY OF PRESENT ILLNESS  12/07/17 2:05 AM Cody Hawkins is a 47 y.o. male with a history of GERD not currently on a PPI.  He is here with a one-week history of chest discomfort.  He describes the chest discomfort as a combination of heartburn substernally with occasional achy pain between his shoulder blades.  The symptoms occur after eating and also occur sometimes at night in bed.  He has had nausea and vomiting with this.  He has had acid reflux in his throat with this.  He has had no shortness of breath or diaphoresis.  Symptoms are not worse with exertion nor improved with rest.  He took 1 dose of Protonix several days ago without significant relief.  Symptoms have been moderate.    Past Medical History:  Diagnosis Date  . Allergy   . Anxiety   . Chicken pox   . Environmental allergies   . Fatigue   . H/O vasectomy   . Hyperlipidemia     Past Surgical History:  Procedure Laterality Date  . BONE CYST EXCISION  1983    Family History  Problem Relation Age of Onset  . Arthritis Mother   . Hyperlipidemia Mother   . Heart disease Mother   . Diabetes Maternal Grandmother   . Cancer Maternal Grandfather   . Stroke Maternal Grandfather     Social History   Tobacco Use  . Smoking status: Former Smoker    Last attempt to quit: 06/20/2007    Years since quitting: 10.4  . Smokeless tobacco: Never Used  Substance Use Topics  . Alcohol use: Yes    Comment: occ  . Drug use: No    Prior to Admission medications   Medication Sig Start Date End Date Taking? Authorizing Provider  acetaminophen (TYLENOL) 325 MG tablet Take 2 tablets (650 mg total) by mouth every 6 (six) hours. 07/25/17   Khatri, Hina, PA-C  atorvastatin (LIPITOR) 20 MG tablet Take 1 tablet (20 mg total) by mouth daily. Patient not  taking: Reported on 01/15/2016 07/17/15   Sheliah Hatchabori, Katherine E, MD  buPROPion (WELLBUTRIN XL) 300 MG 24 hr tablet TAKE 1 TABLET(300 MG) BY MOUTH DAILY. 07/17/17   Sheliah Hatchabori, Katherine E, MD  cyclobenzaprine (FLEXERIL) 10 MG tablet TAKE 1 TABLET(10 MG) BY MOUTH THREE TIMES DAILY AS NEEDED FOR MUSCLE SPASMS 07/26/17   Sheliah Hatchabori, Katherine E, MD  ibuprofen (ADVIL) 200 MG tablet Take 3 tablets (600 mg total) by mouth every 6 (six) hours. 07/25/17   Khatri, Hina, PA-C  oxyCODONE (ROXICODONE) 5 MG immediate release tablet Take 1-2 tablets (5-10 mg total) by mouth every 6 (six) hours as needed for severe pain. 07/24/17   Mack Hookhompson, David, MD  pantoprazole (PROTONIX) 40 MG tablet TAKE 1 TABLET(40 MG) BY MOUTH DAILY 07/18/16   Sheliah Hatchabori, Katherine E, MD    Allergies Tetracyclines & related   REVIEW OF SYSTEMS  Negative except as noted here or in the History of Present Illness.   PHYSICAL EXAMINATION  Initial Vital Signs Blood pressure 109/79, pulse 62, temperature 98.2 F (36.8 C), temperature source Oral, resp. rate 16, height 6' (1.829 m), weight 85.7 kg, SpO2 98 %.  Examination General: Well-developed, well-nourished male in no acute distress; appearance consistent with age of record HENT: normocephalic; atraumatic Eyes: pupils equal, round and reactive to light;  extraocular muscles intact Neck: supple Heart: regular rate and rhythm; no murmur Lungs: clear to auscultation bilaterally Abdomen: soft; nondistended; nontender; no masses or hepatosplenomegaly; bowel sounds present Extremities: No deformity; full range of motion; pulses normal Neurologic: Awake, alert and oriented; motor function intact in all extremities and symmetric; no facial droop Skin: Warm and dry Psychiatric: Normal mood and affect   RESULTS  Summary of this visit's results, reviewed by myself:   EKG Interpretation  Date/Time:  Wednesday December 06 2017 23:22:15 EDT Ventricular Rate:  70 PR Interval:  138 QRS Duration: 90 QT  Interval:  382 QTC Calculation: 412 R Axis:   81 Text Interpretation:  Normal sinus rhythm with sinus arrhythmia Normal ECG Rate is slower Confirmed by Adayah Arocho ([id number]) on 12/06/2017 11:29:56 PM      Laboratory Studies: Results for orders placed or performed during the hospital encounter of 12/07/17 (from the past 24 hour(s))  Basic metabolic panel     Status: Abnormal   Collection Time: 12/06/17 11:30 PM  Result Value Ref Range   Sodium 139 135 - 145 mmol/L   Potassium 4.0 3.5 - 5.1 mmol/L   Chloride 101 98 - 111 mmol/L   CO2 28 22 - 32 mmol/L   Glucose, Bld 105 (H) 70 - 99 mg/dL   BUN 15 6 - 20 mg/dL   Creatinine, Ser 6.04 0.61 - 1.24 mg/dL   Calcium 9.4 8.9 - 54.0 mg/dL   GFR calc non Af Amer >60 >60 mL/min   GFR calc Af Amer >60 >60 mL/min   Anion gap 10 5 - 15  CBC     Status: None   Collection Time: 12/06/17 11:30 PM  Result Value Ref Range   WBC 8.4 4.0 - 10.5 K/uL   RBC 5.48 4.22 - 5.81 MIL/uL   Hemoglobin 16.7 13.0 - 17.0 g/dL   HCT 98.1 19.1 - 47.8 %   MCV 85.6 78.0 - 100.0 fL   MCH 30.5 26.0 - 34.0 pg   MCHC 35.6 30.0 - 36.0 g/dL   RDW 29.5 62.1 - 30.8 %   Platelets 238 150 - 400 K/uL  Troponin I     Status: None   Collection Time: 12/06/17 11:30 PM  Result Value Ref Range   Troponin I <0.03 <0.03 ng/mL   Imaging Studies: Dg Chest 2 View  Result Date: 12/07/2017 CLINICAL DATA:  Chest pain for 1 week. Previous smoker 11 years ago. EXAM: CHEST - 2 VIEW COMPARISON:  12/31/2016 FINDINGS: Normal heart size and pulmonary vascularity. No focal airspace disease or consolidation in the lungs. No blunting of costophrenic angles. No pneumothorax. Mediastinal contours appear intact. An expansile lucent lesion is demonstrated in the proximal right humerus, incompletely included on this image. It is better seen on a previous chest from 07/22/2014 and likely represents old fracture deformity. Possible bone cyst. IMPRESSION: No evidence of active pulmonary disease.  Benign-appearing lesion in the proximal right humerus. Electronically Signed   By: Burman Nieves M.D.   On: 12/07/2017 00:28    ED COURSE and MDM  Nursing notes and initial vitals signs, including pulse oximetry, reviewed.  Vitals:   12/06/17 2324 12/06/17 2325 12/07/17 0127  BP: 131/88  109/79  Pulse: 80  62  Resp: 20  16  Temp: 98.2 F (36.8 C)    TempSrc: Oral    SpO2: 99%  98%  Weight:  85.7 kg   Height:  6' (1.829 m)    History is consistent with GERD.  The pain between his shoulder blades may represent esophageal spasm.  We will restart him on a PPI and refer him to his PCP.  I have a low index of suspicion for cardiac etiology.  PROCEDURES    ED DIAGNOSES     ICD-10-CM   1. Chest pain due to GERD R07.9    K21.9        Afifa Truax, MD 12/07/17 [id number]

## 2017-12-11 ENCOUNTER — Encounter: Payer: Self-pay | Admitting: Family Medicine

## 2017-12-11 ENCOUNTER — Ambulatory Visit: Payer: BC Managed Care – PPO | Admitting: Family Medicine

## 2017-12-11 ENCOUNTER — Other Ambulatory Visit: Payer: Self-pay

## 2017-12-11 VITALS — BP 110/80 | HR 73 | Temp 98.3°F | Resp 16 | Ht 72.0 in | Wt 188.5 lb

## 2017-12-11 DIAGNOSIS — K219 Gastro-esophageal reflux disease without esophagitis: Secondary | ICD-10-CM | POA: Diagnosis not present

## 2017-12-11 NOTE — Patient Instructions (Signed)
Follow up as needed or as scheduled CONTINUE the Pantoprazole daily ADD the Sucralfate as needed for particularly bad days Take the Zofran as needed for nausea Try and avoid caffeine, alcohol, and spicy food until feeling better Call with any questions or concerns Hang in there!!!

## 2017-12-11 NOTE — Assessment & Plan Note (Signed)
Deteriorated.  Pt had EMS visit and ER visit w/ normal EKG x2.  Low risk for cardiac issues.  Reviewed need for daily medication along w/ dietary and lifestyle modifications.  Discussed the possibility of adding carafate- he has some of this available at home to use as needed.  Reviewed supportive care and red flags that should prompt return.  Pt expressed understanding and is in agreement w/ plan.

## 2017-12-11 NOTE — Progress Notes (Signed)
   Subjective:    Patient ID: Cody Hawkins, male    DOB: 11/06/1970, 47 y.o.   MRN: [id number]  HPI ER f/u- pt was seen in ER 9/18 for nausea, substernal CP that radiated to between his shoulder blades.   Pt reports he had nausea x7 days (starting 9/11), 'nothing I ate would digest.  It was all coming back up'.  Was dx'd w/ GERD and was given Protonix w/ some improvement.  'I feel good today'.  Denies increased stress recently.  No changes to diet or medication.  No recent NSAIDs.  No recent viral illnesses.   Review of Systems For ROS see HPI     Objective:   Physical Exam  Constitutional: He is oriented to person, place, and time. He appears well-developed and well-nourished. No distress.  HENT:  Head: Normocephalic and atraumatic.  Eyes: Pupils are equal, round, and reactive to light. Conjunctivae and EOM are normal.  Neck: Normal range of motion. Neck supple. No thyromegaly present.  Cardiovascular: Normal rate, regular rhythm, normal heart sounds and intact distal pulses.  No murmur heard. Pulmonary/Chest: Effort normal and breath sounds normal. No respiratory distress.  Abdominal: Soft. Bowel sounds are normal. He exhibits no distension.  Musculoskeletal: He exhibits no edema.  Lymphadenopathy:    He has no cervical adenopathy.  Neurological: He is alert and oriented to person, place, and time. No cranial nerve deficit.  Skin: Skin is warm and dry.  Psychiatric: He has a normal mood and affect. His behavior is normal.  Vitals reviewed.         Assessment & Plan:

## 2018-02-12 ENCOUNTER — Other Ambulatory Visit: Payer: Self-pay | Admitting: Family Medicine

## 2018-03-07 ENCOUNTER — Other Ambulatory Visit: Payer: Self-pay | Admitting: Family Medicine

## 2018-03-27 ENCOUNTER — Other Ambulatory Visit: Payer: Self-pay | Admitting: Family Medicine

## 2018-06-21 ENCOUNTER — Ambulatory Visit: Payer: Self-pay | Admitting: *Deleted

## 2018-06-21 NOTE — Telephone Encounter (Signed)
Pt reports cough, onset yesterday. States productive for minimal amount clear phlegm. States temp (temporal) 99.1. Also reports "Mild SOB, feels like I just need to cough all the time and something needs to come up." Has taken Benadryl and Claritin, ineffective.States chest feel "A little tight." Denies body aches. States can taste, "Can smell a little."  No travel, no known exposure. Pt has iPhone, Email verified, updated.  Advised if appt appropriate he would hear from practice, reviewed process.Advised per protocol;  self isolate until afebrile 72 hours, asymptomatic for 7 days. CB# if appt appropriate: [phone]  Reason for Disposition . COVID-19 Home Isolation, questions about  Answer Assessment - Initial Assessment Questions 1. COVID-19 DIAGNOSIS: "Who made your Coronavirus (COVID-19) diagnosis?" "Was it confirmed by a positive lab test?" If not diagnosed by a HCP, ask "Are there lots of cases (community spread) where you live?" (See public health department website, if unsure)   * MAJOR community spread: high number of cases; numbers of cases are increasing; many people hospitalized.   * MINOR community spread: low number of cases; not increasing; few or no people hospitalized     N/A 2. ONSET: "When did the COVID-19 symptoms start?"      Cough, yesterday 3. WORST SYMPTOM: "What is your worst symptom?" (e.g., cough, fever, shortness of breath, muscle aches)     Mild chest tightness 4. COUGH: "How bad is the cough?"       "Feel need to cough all the time." 5. FEVER: "Do you have a fever?" If so, ask: "What is your temperature, how was it measured, and when did it start?"     Not sure. 99.1 temporal  6. RESPIRATORY STATUS: "Describe your breathing?" (e.g., shortness of breath, wheezing, unable to speak)      Mild SOB 7. BETTER-SAME-WORSE: "Are you getting better, staying the same or getting worse compared to yesterday?"  If getting worse, ask, "In what way?"     Little worse. 8.  HIGH H RISK DISEASE: "Do you have any chronic medical problems?" (e.g., asthma, heart or lung disease, weak immune system, etc.)    no  10. OTHER SYMPTOMS: "Do you have any other symptoms?"  (e.g., runny nose, headache, sore throat, loss of smell)       No, Has taste, "Can smell some." Mild chest tightness  Protocols used: CORONAVIRUS (COVID-19) DIAGNOSED OR SUSPECTED-A-AH

## 2018-06-21 NOTE — Telephone Encounter (Signed)
Please schedule pt for virtual visit for tomorrow so I can check on him

## 2018-06-21 NOTE — Telephone Encounter (Signed)
Pt is scheduled for a 11:30 tomorrow ok per Beverely Low

## 2018-06-22 ENCOUNTER — Ambulatory Visit (INDEPENDENT_AMBULATORY_CARE_PROVIDER_SITE_OTHER): Payer: BC Managed Care – PPO | Admitting: Family Medicine

## 2018-06-22 ENCOUNTER — Encounter: Payer: Self-pay | Admitting: Family Medicine

## 2018-06-22 ENCOUNTER — Other Ambulatory Visit: Payer: Self-pay

## 2018-06-22 VITALS — Temp 99.3°F | Ht 72.0 in | Wt 179.0 lb

## 2018-06-22 DIAGNOSIS — R6889 Other general symptoms and signs: Secondary | ICD-10-CM

## 2018-06-22 DIAGNOSIS — Z20822 Contact with and (suspected) exposure to covid-19: Secondary | ICD-10-CM

## 2018-06-22 NOTE — Progress Notes (Signed)
   Virtual Visit via Video   I connected with@ on 06/22/18 at 11:30 AM EDT by a video enabled telemedicine application and verified that I am speaking with the correct person using two identifiers. Location patient: Home Location provider: [facility], Office Persons participating in the virtual visit: pt and myself  I discussed the limitations of evaluation and management by telemedicine and the availability of in person appointments. The patient expressed understanding and agreed to proceed.  Subjective:   HPI:  Cough/SOB- 'I feel a lot better today'.  Wednesday developed a cough- as the night went on, felt chest congestion but 'couldn't get it out'.  Thursday he woke and 'felt like concrete in my chest'.  Went to work.  Came home Tm 99.9.  Yesterday was having intermittent cough and SOB.  'it just felt very different'.  Tried benadryl, claritin, mucinex.  Lungs feel 'much better' today, less need to cough.  Denies body aches.  No sore throat.  Mild nausea.  ROS: See pertinent positives and negatives per HPI.  Patient Active Problem List   Diagnosis Date Noted  . Hyperlipidemia 07/18/2016  . GERD (gastroesophageal reflux disease) 07/25/2014  . Anxiety and depression 08/20/2012  . Decreased libido 08/20/2012  . General medical examination 07/04/2011  . Palpitations 06/28/2011  . Chest pain 06/28/2011    Social History   Tobacco Use  . Smoking status: Former Smoker    Last attempt to quit: 06/20/2007    Years since quitting: 11.0  . Smokeless tobacco: Never Used  Substance Use Topics  . Alcohol use: Yes    Comment: occ    Current Outpatient Medications:  .  acetaminophen (TYLENOL) 325 MG tablet, Take 2 tablets (650 mg total) by mouth every 6 (six) hours., Disp: 60 tablet, Rfl: 0 .  buPROPion (WELLBUTRIN XL) 300 MG 24 hr tablet, TAKE 1 TABLET(300 MG) BY MOUTH DAILY., Disp: 30 tablet, Rfl: 3 .  cyclobenzaprine (FLEXERIL) 10 MG tablet, TAKE 1 TABLET(10 MG) BY MOUTH THREE  TIMES DAILY AS NEEDED FOR MUSCLE SPASMS, Disp: 30 tablet, Rfl: 0 .  ibuprofen (ADVIL) 200 MG tablet, Take 3 tablets (600 mg total) by mouth every 6 (six) hours., Disp: 60 tablet, Rfl: 0 .  pantoprazole (PROTONIX) 40 MG tablet, TAKE 1 TABLET(40 MG) BY MOUTH DAILY, Disp: 30 tablet, Rfl: 6  Allergies  Allergen Reactions  . Tetracyclines & Related Other (See Comments)    Pt states caused a strange odor come out of body     Objective:   Temp 99.3 F (37.4 C)   Ht 6' (1.829 m)   Wt 179 lb (81.2 kg)   BMI 24.28 kg/m  AAOx3, NAD NCAT, EOMI No obvious CN deficits Coloring WNL Pt is able to speak clearly, coherently without shortness of breath or increased work of breathing.  Thought process is linear.  Mood is appropriate.   Assessment and Plan:   Suspected Covid- pt has cough, chest tightness, some shortness of breath w/ exertion, low grade temp, intermittent nausea.  Thankfully sxs are currently mild and he started his self quarantine yesterday and notified his job.  Reviewed sxs that should prompt trip to ER- worsening shortness of breath, inability to eat or drink.  Pt is aware.  Also discussed when he is able to stop quarantine- 3 days fever free AND at least 7 days from sxs onset.  Pt expressed understanding and is in agreement w/ plan.    Neena Rhymes, MD 06/22/2018

## 2018-06-22 NOTE — Progress Notes (Signed)
I have discussed the procedure for the virtual visit with the patient who has given consent to proceed with assessment and treatment.   Mckaila Duffus, CMA     

## 2018-06-27 ENCOUNTER — Telehealth: Payer: Self-pay

## 2018-06-27 NOTE — Telephone Encounter (Signed)
F/U from 06/22/18 appt with Dr. Beverely Low  Temperature is fluctuating thru out the day from 98.5, mid afternoon 99.4, and most nights goes up to 99.8 around 6/7 PM. Continuing tylenol thru out the day.  Cough is getting better, taking mucinex Slight burning in chest and "tickle" of throat. Quarantined in bedroom with personal bathroom and taking some walks at night, but wearing mask. EMS was called on Saturday, patient thought he was "dying," EMS instructed him to up his intake of fluids.

## 2018-06-28 ENCOUNTER — Telehealth: Payer: Self-pay

## 2018-06-28 NOTE — Telephone Encounter (Signed)
Spoke with patient, he states he's "still kickin".  Patient reports current temp of 99.3, up to 100 degrees last night. Denies SOB, appetite has improved.  Continuing to take Tylenol throughout the day.  Patient is remaining in basement while family is staying upstairs.  Advised to call with any questions/concerns.

## 2018-07-26 ENCOUNTER — Other Ambulatory Visit: Payer: Self-pay | Admitting: Family Medicine

## 2018-07-26 NOTE — Telephone Encounter (Signed)
Please advise, pt was just seen for suspected Covid, has not had a follow up in awhile.

## 2018-08-02 ENCOUNTER — Encounter: Payer: Self-pay | Admitting: Family Medicine

## 2018-09-28 IMAGING — DX DG FINGER INDEX 2+V*L*
3 series · 3 of 3 positions shown · non-contrast
Comparison: None.

CLINICAL DATA: Laceration to distal left index finger from heads
clippers.

EXAM:
LEFT INDEX FINGER 2+V

[finger ap]
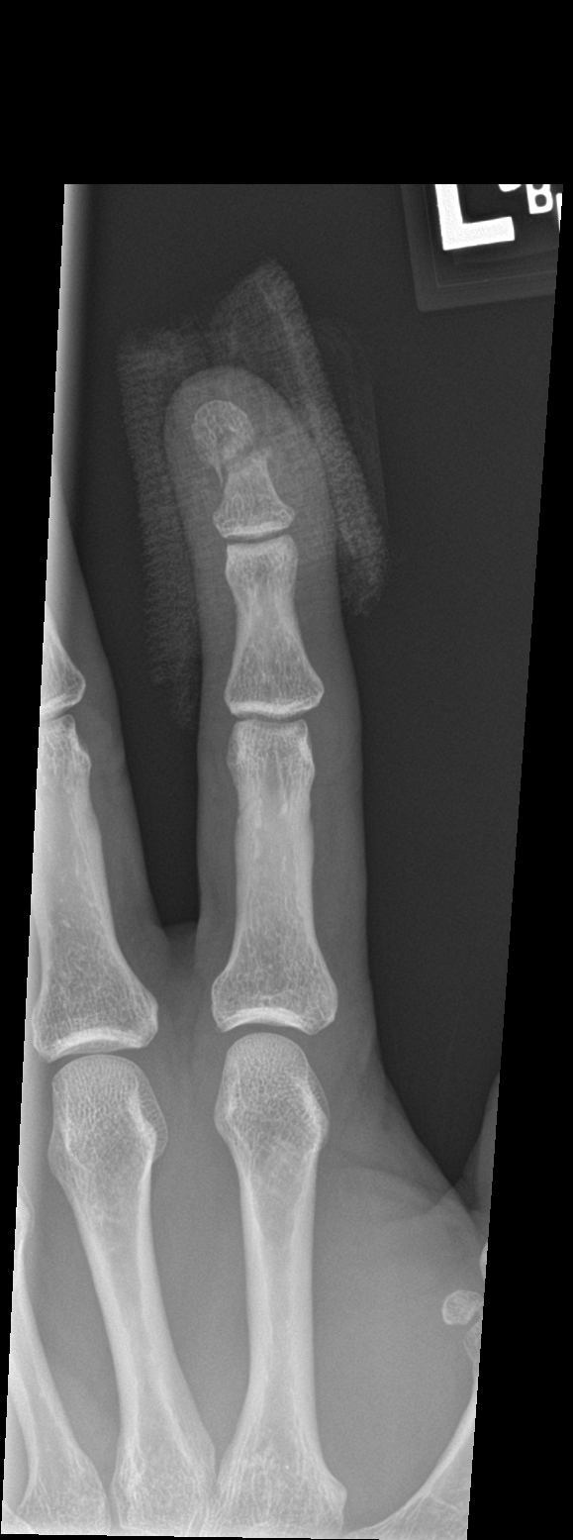

[finger obl]
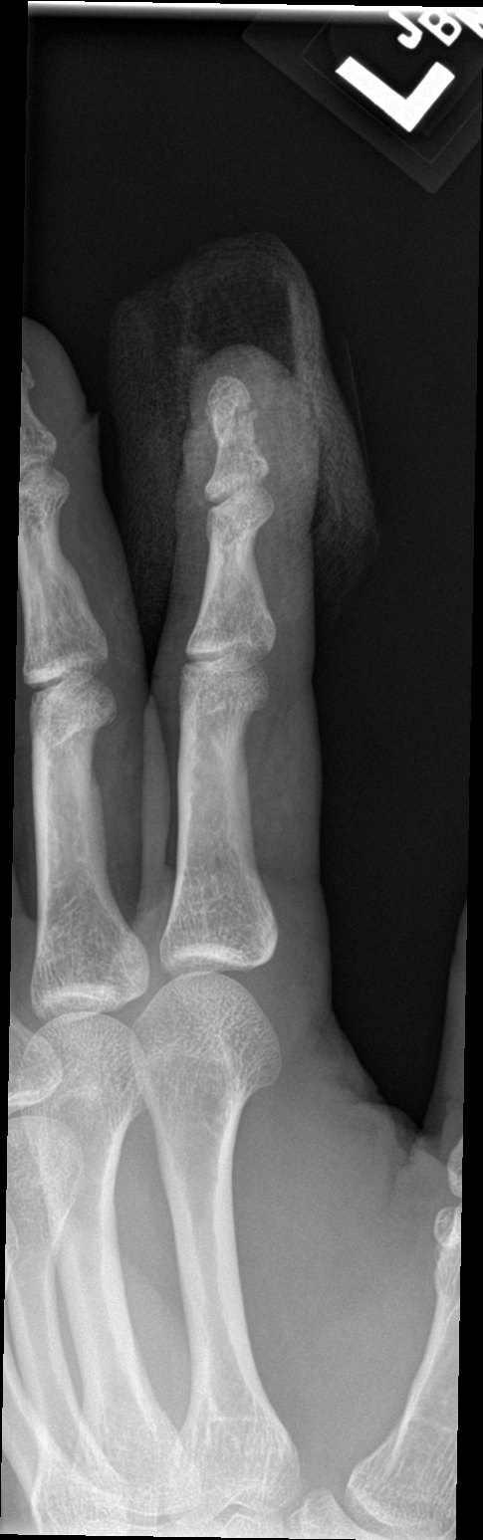

[finger lat]
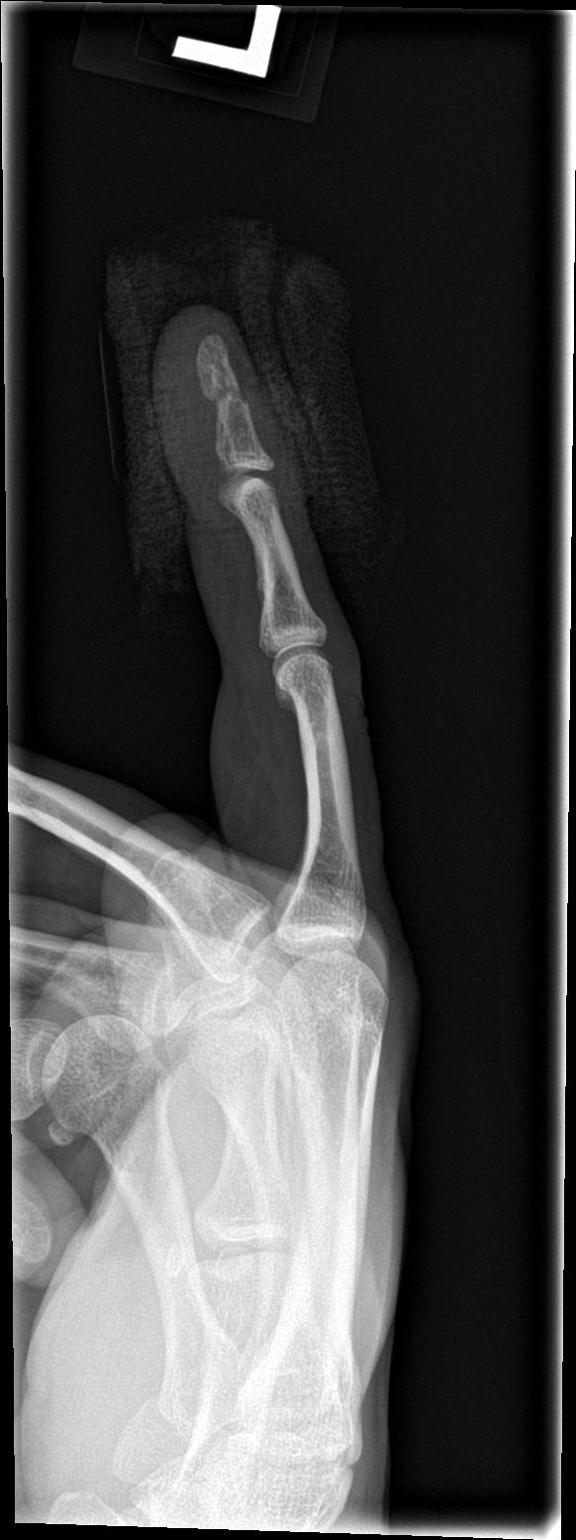

[3 of 3 positions shown; findings below may reference images not displayed]

FINDINGS: There is a fracture across the mid to distal shaft of the distal
phalanx of the index finger, mildly displaced in a distal, ulnar
direction by 2 mm. There is an associated soft tissue laceration.
The distal fracture component is angulated in an ulnar direction by
approximately 18 degrees.

No other fractures.  The joints are normally spaced and aligned.

No radiopaque foreign bodies.
IMPRESSION: 1. Fracture of the distal phalanx of the left index finger
associated with a soft tissue laceration.
2. No radiopaque foreign body.  No dislocation.

## 2018-11-04 ENCOUNTER — Other Ambulatory Visit: Payer: Self-pay | Admitting: Family Medicine

## 2018-12-05 ENCOUNTER — Other Ambulatory Visit: Payer: Self-pay

## 2018-12-05 ENCOUNTER — Ambulatory Visit (INDEPENDENT_AMBULATORY_CARE_PROVIDER_SITE_OTHER): Payer: BC Managed Care – PPO | Admitting: Family Medicine

## 2018-12-05 ENCOUNTER — Encounter: Payer: Self-pay | Admitting: Family Medicine

## 2018-12-05 VITALS — Temp 98.6°F | Ht 72.0 in | Wt 191.0 lb

## 2018-12-05 DIAGNOSIS — K219 Gastro-esophageal reflux disease without esophagitis: Secondary | ICD-10-CM | POA: Diagnosis not present

## 2018-12-05 DIAGNOSIS — R509 Fever, unspecified: Secondary | ICD-10-CM

## 2018-12-05 DIAGNOSIS — Z20822 Contact with and (suspected) exposure to covid-19: Secondary | ICD-10-CM

## 2018-12-05 DIAGNOSIS — R6889 Other general symptoms and signs: Secondary | ICD-10-CM

## 2018-12-05 DIAGNOSIS — M791 Myalgia, unspecified site: Secondary | ICD-10-CM

## 2018-12-05 NOTE — Progress Notes (Signed)
I have discussed the procedure for the virtual visit with the patient who has given consent to proceed with assessment and treatment.   Jessica L Brodmerkel, CMA     

## 2018-12-05 NOTE — Progress Notes (Signed)
Virtual Visit via Video   I connected with patient on 12/05/18 at 11:00 AM EDT by a video enabled telemedicine application and verified that I am speaking with the correct person using two identifiers.  Location patient: Home Location provider: [facility], Office Persons participating in the virtual visit: Patient, Provider, [place] (Jess B)  I discussed the limitations of evaluation and management by telemedicine and the availability of in person appointments. The patient expressed understanding and agreed to proceed.  Subjective:   HPI:   Body aches- intermittent.  Shoulders and across chest.  Has intensified in the last 2-3 weeks.  Occurs in the afternoons, worse w/ sitting.  Pt w/ low grade temps, Tm 99.9 via temporal scanner.  sxs will resolve in the evenings.  'feels like I pulled a muscle'.  Mildly TTP.  No relief w/ heating pad.  Taking tylenol and flexeril w/o relief.  Occasional headaches.  No cough/sore throat.  No diarrhea.  No recent tick bites.  No weakness of arms or decreased ROM.  GERD- pt reports in the last 4-6 weeks he feels that he is regurgitating after eating.  Has vomited 5-6x.  Taking the Protonix daily.  Trying to watch his diet.  ROS:   See pertinent positives and negatives per HPI.  Patient Active Problem List   Diagnosis Date Noted  . Hyperlipidemia 07/18/2016  . GERD (gastroesophageal reflux disease) 07/25/2014  . Anxiety and depression 08/20/2012  . Decreased libido 08/20/2012  . General medical examination 07/04/2011  . Palpitations 06/28/2011  . Chest pain 06/28/2011    Social History   Tobacco Use  . Smoking status: Former Smoker    Quit date: 06/20/2007    Years since quitting: 11.4  . Smokeless tobacco: Never Used  Substance Use Topics  . Alcohol use: Yes    Comment: occ    Current Outpatient Medications:  .  acetaminophen (TYLENOL) 325 MG tablet, Take 2 tablets (650 mg total) by mouth every 6 (six) hours., Disp: 60 tablet, Rfl:  0 .  buPROPion (WELLBUTRIN XL) 300 MG 24 hr tablet, TAKE 1 TABLET BY MOUTH DAILY, Disp: 30 tablet, Rfl: 3 .  cyclobenzaprine (FLEXERIL) 10 MG tablet, TAKE 1 TABLET(10 MG) BY MOUTH THREE TIMES DAILY AS NEEDED FOR MUSCLE SPASMS, Disp: 30 tablet, Rfl: 0 .  ibuprofen (ADVIL) 200 MG tablet, Take 3 tablets (600 mg total) by mouth every 6 (six) hours., Disp: 60 tablet, Rfl: 0 .  pantoprazole (PROTONIX) 40 MG tablet, TAKE 1 TABLET(40 MG) BY MOUTH DAILY, Disp: 30 tablet, Rfl: 6  Allergies  Allergen Reactions  . Tetracyclines & Related Other (See Comments)    Pt states caused a strange odor come out of body     Objective:   Temp 98.6 F (37 C) (Tympanic)   Ht 6' (1.829 m)   Wt 191 lb (86.6 kg)   BMI 25.90 kg/m   AAOx3, NAD NCAT, EOMI No obvious CN deficits Coloring WNL Pt is able to speak clearly, coherently without shortness of breath or increased work of breathing.  Thought process is linear.  Mood is appropriate.   Assessment and Plan:   Myalgias- new.  Low suspicion for COVID 19 but given that he needs labs, we need to be sure before he can come in.  Will get COVID test later today and if negative, will proceed w/ myalgia workup- including tick borne illness, ANA, ESR.  Pt to alternate tylenol/ibuprofen for symptom relief.  If no obvious cause on labs, will do  trial of steroid and refer to Rheum.  Pt expressed understanding and is in agreement w/ plan.   GERD- deteriorated.  Will check for H pylori and increase Protonix to BID.  Reviewed lifestyle and dietary modifications that will improve sxs.  Will follow closely.   Annye Asa, MD 12/05/2018

## 2018-12-06 ENCOUNTER — Encounter (INDEPENDENT_AMBULATORY_CARE_PROVIDER_SITE_OTHER): Payer: Self-pay

## 2018-12-06 LAB — NOVEL CORONAVIRUS, NAA: SARS-CoV-2, NAA: NOT DETECTED

## 2018-12-07 ENCOUNTER — Encounter (INDEPENDENT_AMBULATORY_CARE_PROVIDER_SITE_OTHER): Payer: Self-pay

## 2018-12-07 ENCOUNTER — Ambulatory Visit (INDEPENDENT_AMBULATORY_CARE_PROVIDER_SITE_OTHER): Payer: BC Managed Care – PPO

## 2018-12-07 ENCOUNTER — Other Ambulatory Visit: Payer: Self-pay

## 2018-12-07 DIAGNOSIS — R509 Fever, unspecified: Secondary | ICD-10-CM | POA: Diagnosis not present

## 2018-12-07 DIAGNOSIS — M791 Myalgia, unspecified site: Secondary | ICD-10-CM | POA: Diagnosis not present

## 2018-12-07 LAB — CBC WITH DIFFERENTIAL/PLATELET
Basophils Absolute: 0 10*3/uL (ref 0.0–0.1)
Basophils Relative: 0.7 % (ref 0.0–3.0)
Eosinophils Absolute: 0.1 10*3/uL (ref 0.0–0.7)
Eosinophils Relative: 2 % (ref 0.0–5.0)
HCT: 46.3 % (ref 39.0–52.0)
Hemoglobin: 16 g/dL (ref 13.0–17.0)
Lymphocytes Relative: 37.2 % (ref 12.0–46.0)
Lymphs Abs: 2.3 10*3/uL (ref 0.7–4.0)
MCHC: 34.5 g/dL (ref 30.0–36.0)
MCV: 86.6 fl (ref 78.0–100.0)
Monocytes Absolute: 0.3 10*3/uL (ref 0.1–1.0)
Monocytes Relative: 5.1 % (ref 3.0–12.0)
Neutro Abs: 3.4 10*3/uL (ref 1.4–7.7)
Neutrophils Relative %: 55 % (ref 43.0–77.0)
Platelets: 237 10*3/uL (ref 150.0–400.0)
RBC: 5.35 Mil/uL (ref 4.22–5.81)
RDW: 13 % (ref 11.5–15.5)
WBC: 6.3 10*3/uL (ref 4.0–10.5)

## 2018-12-07 LAB — SEDIMENTATION RATE: Sed Rate: 1 mm/hr (ref 0–15)

## 2018-12-07 LAB — CK: Total CK: 86 U/L (ref 7–232)

## 2018-12-07 NOTE — Addendum Note (Signed)
Addended by: Doran Clay A on: 12/07/2018 10:34 AM   Modules accepted: Orders

## 2018-12-08 ENCOUNTER — Encounter (INDEPENDENT_AMBULATORY_CARE_PROVIDER_SITE_OTHER): Payer: Self-pay

## 2018-12-09 ENCOUNTER — Encounter (INDEPENDENT_AMBULATORY_CARE_PROVIDER_SITE_OTHER): Payer: Self-pay

## 2018-12-10 ENCOUNTER — Other Ambulatory Visit: Payer: Self-pay | Admitting: General Practice

## 2018-12-10 MED ORDER — DOXYCYCLINE HYCLATE 100 MG PO TABS: 100.0000 mg | ORAL_TABLET | Freq: Two times a day (BID) | ORAL | 0 refills | Status: DC

## 2018-12-11 LAB — H PYLORI, IGM, IGG, IGA AB
H pylori, IgM Abs: <9 units (ref 0.0–8.9)
H. pylori, IgA Abs: <9 units (ref 0.0–8.9)
H. pylori, IgG AbS: 0.7 Index Value (ref 0.00–0.79)

## 2018-12-12 LAB — ANA: Anti Nuclear Antibody (ANA): NEGATIVE

## 2018-12-12 LAB — EHRLICHIA ANTIBODY PANEL
E. CHAFFEENSIS AB IGG: <1:64 {titer}
E. CHAFFEENSIS AB IGM: <1:20 {titer}

## 2018-12-12 LAB — B. BURGDORFI ANTIBODIES BY WB
B burgdorferi IgG Abs (IB): NEGATIVE
B burgdorferi IgM Abs (IB): NEGATIVE
Lyme Disease 18 kD IgG: NONREACTIVE
Lyme Disease 23 kD IgG: NONREACTIVE
Lyme Disease 23 kD IgM: NONREACTIVE
Lyme Disease 28 kD IgG: NONREACTIVE
Lyme Disease 30 kD IgG: NONREACTIVE
Lyme Disease 39 kD IgG: NONREACTIVE
Lyme Disease 39 kD IgM: REACTIVE — AB
Lyme Disease 41 kD IgG: NONREACTIVE
Lyme Disease 41 kD IgM: NONREACTIVE
Lyme Disease 45 kD IgG: NONREACTIVE
Lyme Disease 58 kD IgG: NONREACTIVE
Lyme Disease 66 kD IgG: NONREACTIVE
Lyme Disease 93 kD IgG: NONREACTIVE

## 2018-12-12 LAB — LYME AB SCREEN %: Lyme AB Screen: 2.6 index — ABNORMAL HIGH

## 2018-12-12 LAB — ROCKY MTN SPOTTED FVR ABS PNL(IGG+IGM)
RMSF IgG: NOT DETECTED
RMSF IgM: NOT DETECTED

## 2018-12-13 ENCOUNTER — Encounter (INDEPENDENT_AMBULATORY_CARE_PROVIDER_SITE_OTHER): Payer: Self-pay

## 2018-12-16 ENCOUNTER — Telehealth: Payer: Self-pay

## 2018-12-16 ENCOUNTER — Encounter (INDEPENDENT_AMBULATORY_CARE_PROVIDER_SITE_OTHER): Payer: Self-pay

## 2018-12-16 NOTE — Telephone Encounter (Signed)
Spoke with patient regarding the Weakness that he was experiencing. Patient was advise per protocol if worsening weakness with the inability to stand or if he had to hold on to something to get his balance,that he would need to call 911 or seek treatment at ED. Patient verbalize understanding and states that it is not that severe. He will continue to monitor symptoms and follow necessary protocol.

## 2018-12-18 ENCOUNTER — Encounter (INDEPENDENT_AMBULATORY_CARE_PROVIDER_SITE_OTHER): Payer: Self-pay

## 2018-12-21 ENCOUNTER — Encounter: Payer: Self-pay | Admitting: Family Medicine

## 2018-12-24 MED ORDER — DOXYCYCLINE HYCLATE 100 MG PO TABS: 100.0000 mg | ORAL_TABLET | Freq: Two times a day (BID) | ORAL | 0 refills | Status: DC

## 2019-05-31 ENCOUNTER — Ambulatory Visit: Payer: BC Managed Care – PPO | Attending: Internal Medicine

## 2019-05-31 DIAGNOSIS — Z23 Encounter for immunization: Secondary | ICD-10-CM

## 2019-05-31 NOTE — Progress Notes (Signed)
   Covid-19 Vaccination Clinic  Name:  Cody Hawkins    MRN: [id number] DOB: 05-Oct-1970  05/31/2019  Mr. Cody Hawkins was observed post Covid-19 immunization for 15 minutes without incident. He was provided with Vaccine Information Sheet and instruction to access the V-Safe system.   Mr. Cody Hawkins was instructed to call 911 with any severe reactions post vaccine: Marland Kitchen Difficulty breathing  . Swelling of face and throat  . A fast heartbeat  . A bad rash all over body  . Dizziness and weakness   Immunizations Administered    Name Date Dose VIS Date Route   Pfizer COVID-19 Vaccine 05/31/2019 11:04 AM 0.3 mL 03/01/2019 Intramuscular   Manufacturer: [employer], [employer]   Lot: [id number]   NDC: [address]-3

## 2019-06-26 ENCOUNTER — Ambulatory Visit: Payer: BC Managed Care – PPO

## 2019-07-03 ENCOUNTER — Ambulatory Visit: Payer: BC Managed Care – PPO | Attending: Internal Medicine

## 2019-07-03 DIAGNOSIS — Z23 Encounter for immunization: Secondary | ICD-10-CM

## 2019-07-03 NOTE — Progress Notes (Signed)
   Covid-19 Vaccination Clinic  Name:  Cody Hawkins    MRN: [id number] DOB: January 04, 1971  07/03/2019  Mr. [name] was observed post Covid-19 immunization for 15 minutes without incident. He was provided with Vaccine Information Sheet and instruction to access the V-Safe system.   Mr. [name] was instructed to call 911 with any severe reactions post vaccine: Marland Kitchen Difficulty breathing  . Swelling of face and throat  . A fast heartbeat  . A bad rash all over body  . Dizziness and weakness   Immunizations Administered    Name Date Dose VIS Date Route   Pfizer COVID-19 Vaccine 07/03/2019  1:37 PM 0.3 mL 03/01/2019 Intramuscular   Manufacturer: [employer], [employer]   Lot: [id number]   NDC: [address]-4

## 2020-06-12 ENCOUNTER — Ambulatory Visit (INDEPENDENT_AMBULATORY_CARE_PROVIDER_SITE_OTHER): Payer: BC Managed Care – PPO | Admitting: Family Medicine

## 2020-06-12 ENCOUNTER — Encounter: Payer: Self-pay | Admitting: Family Medicine

## 2020-06-12 ENCOUNTER — Other Ambulatory Visit: Payer: Self-pay

## 2020-06-12 VITALS — BP 120/78 | HR 78 | Temp 97.8°F | Resp 20 | Ht 72.0 in | Wt 198.2 lb

## 2020-06-12 DIAGNOSIS — Z125 Encounter for screening for malignant neoplasm of prostate: Secondary | ICD-10-CM | POA: Diagnosis not present

## 2020-06-12 DIAGNOSIS — E663 Overweight: Secondary | ICD-10-CM | POA: Diagnosis not present

## 2020-06-12 DIAGNOSIS — Z0000 Encounter for general adult medical examination without abnormal findings: Secondary | ICD-10-CM | POA: Diagnosis not present

## 2020-06-12 DIAGNOSIS — Z1211 Encounter for screening for malignant neoplasm of colon: Secondary | ICD-10-CM

## 2020-06-12 MED ORDER — CYCLOBENZAPRINE HCL 10 MG PO TABS: ORAL_TABLET | ORAL | 0 refills | Status: DC

## 2020-06-12 NOTE — Assessment & Plan Note (Signed)
Pt's PE WNL.  Due for colon cancer screen- pt prefers Cologuard.  UTD on immunizations.  Check labs.  Anticipatory guidance provided.

## 2020-06-12 NOTE — Progress Notes (Signed)
   Subjective:    Patient ID: Cody Hawkins, male    DOB: [birth year], 50 y.o.   MRN: [id number]  HPI CPE- UTD on Tdap, COVID.  Due for colon cancer screening.    Reviewed past medical, surgical, family and social histories.   Health Maintenance  Topic Date Due  . COLONOSCOPY (Pts 45-13yrs Insurance coverage will need to be confirmed)  Never done  . INFLUENZA VACCINE  07/29/2020 (Originally 10/20/2019)  . COVID-19 Vaccine (3 - Booster for Pfizer series) 11/09/2020 (Originally 01/02/2020)  . Hepatitis C Screening  06/12/2021 (Originally 1971/01/16)  . HIV Screening  06/12/2021 (Originally 05/20/1984)  . TETANUS/TDAP  07/25/2027  . HPV VACCINES  Aged Out      Review of Systems Patient reports no vision/hearing changes, anorexia, fever ,adenopathy, persistant/recurrent hoarseness, swallowing issues, chest pain, palpitations, edema, persistant/recurrent cough, hemoptysis, dyspnea (rest,exertional, paroxysmal nocturnal), gastrointestinal  bleeding (melena, rectal bleeding), abdominal pain, excessive heart burn, GU symptoms (dysuria, hematuria, voiding/incontinence issues) syncope, focal weakness, memory loss, numbness & tingling, skin/hair/nail changes, depression, anxiety, abnormal bruising/bleeding, musculoskeletal symptoms/signs.   This visit occurred during the SARS-CoV-2 public health emergency.  Safety protocols were in place, including screening questions prior to the visit, additional usage of staff PPE, and extensive cleaning of exam room while observing appropriate contact time as indicated for disinfecting solutions.       Objective:   Physical Exam General Appearance:    Alert, cooperative, no distress, appears stated age  Head:    Normocephalic, without obvious abnormality, atraumatic  Eyes:    PERRL, conjunctiva/corneas clear, EOM's intact, fundi    benign, both eyes       Ears:    Normal TM's and external ear canals, both ears  Nose:   Deferred due to COVID  Throat:   Neck:    Supple, symmetrical, trachea midline, no adenopathy;       thyroid:  No enlargement/tenderness/nodules  Back:     Symmetric, no curvature, ROM normal, no CVA tenderness  Lungs:     Clear to auscultation bilaterally, respirations unlabored  Chest wall:    No tenderness or deformity  Heart:    Regular rate and rhythm, S1 and S2 normal, no murmur, rub   or gallop  Abdomen:     Soft, non-tender, bowel sounds active all four quadrants,    no masses, no organomegaly  Genitalia:    deferred  Rectal:    Extremities:   Extremities normal, atraumatic, no cyanosis or edema  Pulses:   2+ and symmetric all extremities  Skin:   Skin color, texture, turgor normal, no rashes or lesions  Lymph nodes:   Cervical, supraclavicular, and axillary nodes normal  Neurologic:   CNII-XII intact. Normal strength, sensation and reflexes      throughout         Assessment & Plan:

## 2020-06-12 NOTE — Patient Instructions (Signed)
Follow up in 1 year or as needed We'll notify you of your lab results and make any changes if needed Continue to work on healthy diet and regular exercise- you look great!!! Complete the Cologuard and return as directed Call with any questions or concerns Stay safe!  Stay healthy!! Happy Spring!!!

## 2020-06-13 LAB — LIPID PANEL
Cholesterol: 246 mg/dL — ABNORMAL HIGH (ref <200)
HDL: 40 mg/dL (ref >=40)
LDL Cholesterol (Calc): 181 mg/dL (calc) — ABNORMAL HIGH
Non-HDL Cholesterol (Calc): 206 mg/dL (calc) — ABNORMAL HIGH (ref <130)
Total CHOL/HDL Ratio: 6.2 (calc) — ABNORMAL HIGH (ref <5.0)
Triglycerides: 118 mg/dL (ref <150)

## 2020-06-13 LAB — CBC WITH DIFFERENTIAL/PLATELET
Absolute Monocytes: 325 cells/uL (ref 200–950)
Basophils Absolute: 41 cells/uL (ref 0–200)
Basophils Relative: 0.7 %
Eosinophils Absolute: 93 cells/uL (ref 15–500)
Eosinophils Relative: 1.6 %
HCT: 46.1 % (ref 38.5–50.0)
Hemoglobin: 16.3 g/dL (ref 13.2–17.1)
Lymphs Abs: 2227 cells/uL (ref 850–3900)
MCH: 30.5 pg (ref 27.0–33.0)
MCHC: 35.4 g/dL (ref 32.0–36.0)
MCV: 86.3 fL (ref 80.0–100.0)
MPV: 10.6 fL (ref 7.5–12.5)
Monocytes Relative: 5.6 %
Neutro Abs: 3115 cells/uL (ref 1500–7800)
Neutrophils Relative %: 53.7 %
Platelets: 281 10*3/uL (ref 140–400)
RBC: 5.34 10*6/uL (ref 4.20–5.80)
RDW: 13 % (ref 11.0–15.0)
Total Lymphocyte: 38.4 %
WBC: 5.8 10*3/uL (ref 3.8–10.8)

## 2020-06-13 LAB — HEPATIC FUNCTION PANEL
AG Ratio: 1.8 (calc) (ref 1.0–2.5)
ALT: 34 U/L (ref 9–46)
AST: 23 U/L (ref 10–35)
Albumin: 4.5 g/dL (ref 3.6–5.1)
Alkaline phosphatase (APISO): 74 U/L (ref 35–144)
Bilirubin, Direct: 0.1 mg/dL (ref 0.0–0.2)
Globulin: 2.5 g/dL (calc) (ref 1.9–3.7)
Indirect Bilirubin: 0.4 mg/dL (calc) (ref 0.2–1.2)
Total Bilirubin: 0.5 mg/dL (ref 0.2–1.2)
Total Protein: 7 g/dL (ref 6.1–8.1)

## 2020-06-13 LAB — BASIC METABOLIC PANEL
BUN: 8 mg/dL (ref 7–25)
CO2: 27 mmol/L (ref 20–32)
Calcium: 9.9 mg/dL (ref 8.6–10.3)
Chloride: 103 mmol/L (ref 98–110)
Creat: 0.9 mg/dL (ref 0.70–1.33)
Glucose, Bld: 82 mg/dL (ref 65–99)
Potassium: 4.3 mmol/L (ref 3.5–5.3)
Sodium: 140 mmol/L (ref 135–146)

## 2020-06-13 LAB — TSH: TSH: 1.41 mIU/L (ref 0.40–4.50)

## 2020-06-13 LAB — PSA: PSA: 0.65 ng/mL (ref <=4.0)

## 2020-06-15 ENCOUNTER — Other Ambulatory Visit: Payer: Self-pay

## 2020-06-15 DIAGNOSIS — E785 Hyperlipidemia, unspecified: Secondary | ICD-10-CM

## 2020-06-15 MED ORDER — ROSUVASTATIN CALCIUM 20 MG PO TABS: 20.0000 mg | ORAL_TABLET | Freq: Every evening | ORAL | 3 refills | Status: DC

## 2020-06-20 LAB — COLOGUARD: Cologuard: NEGATIVE

## 2020-06-26 LAB — COLOGUARD: COLOGUARD: NEGATIVE

## 2020-06-26 LAB — EXTERNAL GENERIC LAB PROCEDURE: COLOGUARD: NEGATIVE

## 2020-09-16 ENCOUNTER — Encounter: Payer: Self-pay | Admitting: *Deleted

## 2021-06-14 ENCOUNTER — Encounter: Payer: Self-pay | Admitting: Family Medicine

## 2021-06-14 ENCOUNTER — Ambulatory Visit (INDEPENDENT_AMBULATORY_CARE_PROVIDER_SITE_OTHER): Payer: BC Managed Care – PPO | Admitting: Family Medicine

## 2021-06-14 VITALS — BP 118/80 | HR 72 | Temp 97.5°F | Resp 16 | Ht 72.0 in | Wt 192.6 lb

## 2021-06-14 DIAGNOSIS — Z0000 Encounter for general adult medical examination without abnormal findings: Secondary | ICD-10-CM

## 2021-06-14 DIAGNOSIS — Z1159 Encounter for screening for other viral diseases: Secondary | ICD-10-CM

## 2021-06-14 DIAGNOSIS — Z114 Encounter for screening for human immunodeficiency virus [HIV]: Secondary | ICD-10-CM | POA: Diagnosis not present

## 2021-06-14 DIAGNOSIS — Z125 Encounter for screening for malignant neoplasm of prostate: Secondary | ICD-10-CM | POA: Diagnosis not present

## 2021-06-14 DIAGNOSIS — E785 Hyperlipidemia, unspecified: Secondary | ICD-10-CM | POA: Diagnosis not present

## 2021-06-14 LAB — CBC WITH DIFFERENTIAL/PLATELET
Basophils Absolute: 0 10*3/uL (ref 0.0–0.1)
Basophils Relative: 0.5 % (ref 0.0–3.0)
Eosinophils Absolute: 0 10*3/uL (ref 0.0–0.7)
Eosinophils Relative: 0.6 % (ref 0.0–5.0)
HCT: 44.5 % (ref 39.0–52.0)
Hemoglobin: 15.3 g/dL (ref 13.0–17.0)
Lymphocytes Relative: 33.1 % (ref 12.0–46.0)
Lymphs Abs: 1.9 10*3/uL (ref 0.7–4.0)
MCHC: 34.3 g/dL (ref 30.0–36.0)
MCV: 88.7 fl (ref 78.0–100.0)
Monocytes Absolute: 0.4 10*3/uL (ref 0.1–1.0)
Monocytes Relative: 6.1 % (ref 3.0–12.0)
Neutro Abs: 3.5 10*3/uL (ref 1.4–7.7)
Neutrophils Relative %: 59.7 % (ref 43.0–77.0)
Platelets: 220 10*3/uL (ref 150.0–400.0)
RBC: 5.02 Mil/uL (ref 4.22–5.81)
RDW: 13.7 % (ref 11.5–15.5)
WBC: 5.8 10*3/uL (ref 4.0–10.5)

## 2021-06-14 NOTE — Patient Instructions (Signed)
Follow up in 1 year or as needed ?We'll notify you of your lab results and make any changes if needed ?Continue to work on healthy diet and regular exercise- you're doing great! ?Call with any questions or concerns ?Stay Safe!  Stay Healthy!! ?Happy Spring!!! ?

## 2021-06-14 NOTE — Assessment & Plan Note (Signed)
Pt is no longer taking Crestor as prescribed.  Check labs and determine if we need to restart meds. ?

## 2021-06-14 NOTE — Progress Notes (Signed)
? ?  Subjective:  ? ? Patient ID: Cody Hawkins, male    DOB: [birth year], 51 y.o.   MRN: [id number] ? ?HPI ?CPE- UTD on Cologuard.  UTD on Tdap.  Pt reports feeling good.  No concerns today. ? ?Health Maintenance  ?Topic Date Due  ? HIV Screening  Never done  ? Hepatitis C Screening  Never done  ? COLONOSCOPY (Pts 45-31yrs Insurance coverage will need to be confirmed)  Never done  ? Zoster Vaccines- Shingrix (1 of 2) Never done  ? COVID-19 Vaccine (3 - Booster for Pfizer series) 08/28/2019  ? TETANUS/TDAP  07/25/2027  ? HPV VACCINES  Aged Out  ? INFLUENZA VACCINE  Discontinued  ?  ? ? ?Review of Systems ?Patient reports no vision/hearing changes, anorexia, fever ,adenopathy, persistant/recurrent hoarseness, swallowing issues, chest pain, palpitations, edema, persistant/recurrent cough, hemoptysis, dyspnea (rest,exertional, paroxysmal nocturnal), gastrointestinal  bleeding (melena, rectal bleeding), abdominal pain, excessive heart burn, GU symptoms (dysuria, hematuria, voiding/incontinence issues) syncope, focal weakness, memory loss, numbness & tingling, skin/hair/nail changes, depression, anxiety, abnormal bruising/bleeding, musculoskeletal symptoms/signs.  ? ?This visit occurred during the SARS-CoV-2 public health emergency.  Safety protocols were in place, including screening questions prior to the visit, additional usage of staff PPE, and extensive cleaning of exam room while observing appropriate contact time as indicated for disinfecting solutions.   ?   ?Objective:  ? Physical Exam ?General Appearance:    Alert, cooperative, no distress, appears stated age  ?Head:    Normocephalic, without obvious abnormality, atraumatic  ?Eyes:    PERRL, conjunctiva/corneas clear, EOM's intact, fundi  ?  benign, both eyes       ?Ears:    Normal TM's and external ear canals, both ears  ?Nose:   Deferred due to COVID  ?Throat:   ?Neck:   Supple, symmetrical, trachea midline, no adenopathy;     ?  thyroid:  No  enlargement/tenderness/nodules  ?Back:     Symmetric, no curvature, ROM normal, no CVA tenderness  ?Lungs:     Clear to auscultation bilaterally, respirations unlabored  ?Chest wall:    No tenderness or deformity  ?Heart:    Regular rate and rhythm, S1 and S2 normal, no murmur, rub ?  or gallop  ?Abdomen:     Soft, non-tender, bowel sounds active all four quadrants,  ?  no masses, no organomegaly  ?Genitalia:    Deferred  ?Rectal:    ?Extremities:   Extremities normal, atraumatic, no cyanosis or edema  ?Pulses:   2+ and symmetric all extremities  ?Skin:   Skin color, texture, turgor normal, no rashes or lesions  ?Lymph nodes:   Cervical, supraclavicular, and axillary nodes normal  ?Neurologic:   CNII-XII intact. Normal strength, sensation and reflexes    ?  throughout  ?  ? ? ? ?   ?Assessment & Plan:  ? ? ?

## 2021-06-14 NOTE — Assessment & Plan Note (Signed)
Pt's PE WNL.  UTD on cologuard and Tdap.  Check labs.  Anticipatory guidance provided.  ?

## 2021-06-15 LAB — BASIC METABOLIC PANEL
BUN: 10 mg/dL (ref 6–23)
CO2: 30 mEq/L (ref 19–32)
Calcium: 9.8 mg/dL (ref 8.4–10.5)
Chloride: 102 mEq/L (ref 96–112)
Creatinine, Ser: 0.95 mg/dL (ref 0.40–1.50)
GFR: 92.31 mL/min (ref >60.00)
Glucose, Bld: 87 mg/dL (ref 70–99)
Potassium: 4.3 mEq/L (ref 3.5–5.1)
Sodium: 139 mEq/L (ref 135–145)

## 2021-06-15 LAB — HEPATIC FUNCTION PANEL
ALT: 26 U/L (ref 0–53)
AST: 24 U/L (ref 0–37)
Albumin: 4.8 g/dL (ref 3.5–5.2)
Alkaline Phosphatase: 70 U/L (ref 39–117)
Bilirubin, Direct: 0.1 mg/dL (ref 0.0–0.3)
Total Bilirubin: 0.5 mg/dL (ref 0.2–1.2)
Total Protein: 7 g/dL (ref 6.0–8.3)

## 2021-06-15 LAB — LIPID PANEL
Cholesterol: 247 mg/dL — ABNORMAL HIGH (ref 0–200)
HDL: 49.4 mg/dL (ref >39.00)
LDL Cholesterol: 170 mg/dL — ABNORMAL HIGH (ref 0–99)
NonHDL: 197.6
Total CHOL/HDL Ratio: 5
Triglycerides: 140 mg/dL (ref 0.0–149.0)
VLDL: 28 mg/dL (ref 0.0–40.0)

## 2021-06-15 LAB — PSA: PSA: 0.98 ng/mL (ref 0.10–4.00)

## 2021-06-15 LAB — TSH: TSH: 1.83 u[IU]/mL (ref 0.35–5.50)

## 2021-06-15 LAB — HEPATITIS C ANTIBODY
Hepatitis C Ab: NONREACTIVE
SIGNAL TO CUT-OFF: <0.02 (ref <1.00)

## 2021-06-15 LAB — HIV ANTIBODY (ROUTINE TESTING W REFLEX): HIV 1&2 Ab, 4th Generation: NONREACTIVE

## 2021-06-15 MED ORDER — ROSUVASTATIN CALCIUM 10 MG PO TABS: 10.0000 mg | ORAL_TABLET | Freq: Every day | ORAL | 1 refills | Status: DC

## 2021-06-15 NOTE — Addendum Note (Signed)
Addended by: Sheliah Hatch on: 06/15/2021 02:28 PM ? ? Modules accepted: Orders ? ?

## 2021-09-09 ENCOUNTER — Telehealth: Payer: Self-pay

## 2021-09-09 NOTE — Telephone Encounter (Signed)
I need more information (such as why he needs a refill) to determine if it can be a simple refill or an appt is needed

## 2021-09-09 NOTE — Telephone Encounter (Signed)
Left pt a vm to call office  

## 2021-09-22 ENCOUNTER — Other Ambulatory Visit: Payer: Self-pay

## 2021-09-22 DIAGNOSIS — M62838 Other muscle spasm: Secondary | ICD-10-CM

## 2021-09-22 MED ORDER — CYCLOBENZAPRINE HCL 10 MG PO TABS: ORAL_TABLET | ORAL | 0 refills | Status: DC

## 2022-06-17 ENCOUNTER — Encounter: Payer: BC Managed Care – PPO | Admitting: Family Medicine

## 2022-06-30 ENCOUNTER — Encounter: Payer: Self-pay | Admitting: Family Medicine

## 2022-06-30 ENCOUNTER — Ambulatory Visit (INDEPENDENT_AMBULATORY_CARE_PROVIDER_SITE_OTHER): Payer: BC Managed Care – PPO | Admitting: Family Medicine

## 2022-06-30 VITALS — BP 124/72 | HR 76 | Temp 98.2°F | Ht 71.0 in | Wt 197.8 lb

## 2022-06-30 DIAGNOSIS — M62838 Other muscle spasm: Secondary | ICD-10-CM | POA: Diagnosis not present

## 2022-06-30 DIAGNOSIS — Z125 Encounter for screening for malignant neoplasm of prostate: Secondary | ICD-10-CM | POA: Diagnosis not present

## 2022-06-30 DIAGNOSIS — E785 Hyperlipidemia, unspecified: Secondary | ICD-10-CM | POA: Diagnosis not present

## 2022-06-30 DIAGNOSIS — Z0000 Encounter for general adult medical examination without abnormal findings: Secondary | ICD-10-CM

## 2022-06-30 MED ORDER — CYCLOBENZAPRINE HCL 10 MG PO TABS: ORAL_TABLET | ORAL | 0 refills | Status: AC

## 2022-06-30 MED ORDER — ROSUVASTATIN CALCIUM 10 MG PO TABS: 10.0000 mg | ORAL_TABLET | Freq: Every day | ORAL | 1 refills | Status: DC

## 2022-06-30 NOTE — Assessment & Plan Note (Signed)
Pt's PE WNL.  UTD on cologuard, Tdap.  Check labs.  Anticipatory guidance provided.  °

## 2022-06-30 NOTE — Patient Instructions (Signed)
Follow up in 1 year or as needed We'll notify you of your lab results and make any changes if needed Keep up the good work on healthy diet and regular exercise- you look great!!! Call with any questions or concerns Stay Safe!  Stay Healthy! Happy Spring!!! 

## 2022-06-30 NOTE — Assessment & Plan Note (Signed)
Tolerating statin w/o difficulty.  Check labs.  Adjust meds prn  

## 2022-06-30 NOTE — Progress Notes (Signed)
   Subjective:    Patient ID: Cody Hawkins, male    DOB: 13-Aug-1970, 52 y.o.   MRN: [id number]  HPI CPE- UTD on colonoguard, Tdap.  No concerns today, 'feeling good'.  Patient Care Team    Relationship Specialty Notifications Start End  Sheliah Hatch, MD PCP - General Family Medicine  07/04/11      Health Maintenance  Topic Date Due   Zoster Vaccines- Shingrix (1 of 2) Never done   COVID-19 Vaccine (3 - 2023-24 season) 11/19/2021   Fecal DNA (Cologuard)  06/21/2023   DTaP/Tdap/Td (3 - Td or Tdap) 07/25/2027   Hepatitis C Screening  Completed   HIV Screening  Completed   HPV VACCINES  Aged Out   INFLUENZA VACCINE  Discontinued     Review of Systems Patient reports no vision/hearing changes, anorexia, fever ,adenopathy, persistant/recurrent hoarseness, swallowing issues, chest pain, palpitations, edema, persistant/recurrent cough, hemoptysis, dyspnea (rest,exertional, paroxysmal nocturnal), gastrointestinal  bleeding (melena, rectal bleeding), abdominal pain, excessive heart burn, GU symptoms (dysuria, hematuria, voiding/incontinence issues) syncope, focal weakness, memory loss, numbness & tingling, skin/hair/nail changes, depression, anxiety, abnormal bruising/bleeding, musculoskeletal symptoms/signs.     Objective:   Physical Exam General Appearance:    Alert, cooperative, no distress, appears stated age  Head:    Normocephalic, without obvious abnormality, atraumatic  Eyes:    PERRL, conjunctiva/corneas clear, EOM's intact both eyes       Ears:    Normal TM's and external ear canals, both ears  Nose:   Nares normal, septum midline, mucosa normal, no drainage   or sinus tenderness  Throat:   Lips, mucosa, and tongue normal; teeth and gums normal  Neck:   Supple, symmetrical, trachea midline, no adenopathy;       thyroid:  No enlargement/tenderness/nodules  Back:     Symmetric, no curvature, ROM normal, no CVA tenderness  Lungs:     Clear to auscultation bilaterally,  respirations unlabored  Chest wall:    No tenderness or deformity  Heart:    Regular rate and rhythm, S1 and S2 normal, no murmur, rub   or gallop  Abdomen:     Soft, non-tender, bowel sounds active all four quadrants,    no masses, no organomegaly  Genitalia:    Deferred  Rectal:    Extremities:   Extremities normal, atraumatic, no cyanosis or edema  Pulses:   2+ and symmetric all extremities  Skin:   Skin color, texture, turgor normal, no rashes or lesions  Lymph nodes:   Cervical, supraclavicular, and axillary nodes normal  Neurologic:   CNII-XII intact. Normal strength, sensation and reflexes      throughout          Assessment & Plan:

## 2022-07-01 ENCOUNTER — Telehealth: Payer: Self-pay

## 2022-07-01 LAB — CBC WITH DIFFERENTIAL/PLATELET
Basophils Absolute: 0 10*3/uL (ref 0.0–0.1)
Basophils Relative: 0.7 % (ref 0.0–3.0)
Eosinophils Absolute: 0.1 10*3/uL (ref 0.0–0.7)
Eosinophils Relative: 1.2 % (ref 0.0–5.0)
HCT: 44.8 % (ref 39.0–52.0)
Hemoglobin: 15.6 g/dL (ref 13.0–17.0)
Lymphocytes Relative: 34.3 % (ref 12.0–46.0)
Lymphs Abs: 1.9 10*3/uL (ref 0.7–4.0)
MCHC: 34.8 g/dL (ref 30.0–36.0)
MCV: 88.2 fl (ref 78.0–100.0)
Monocytes Absolute: 0.3 10*3/uL (ref 0.1–1.0)
Monocytes Relative: 5.7 % (ref 3.0–12.0)
Neutro Abs: 3.3 10*3/uL (ref 1.4–7.7)
Neutrophils Relative %: 58.1 % (ref 43.0–77.0)
Platelets: 239 10*3/uL (ref 150.0–400.0)
RBC: 5.08 Mil/uL (ref 4.22–5.81)
RDW: 13.1 % (ref 11.5–15.5)
WBC: 5.7 10*3/uL (ref 4.0–10.5)

## 2022-07-01 LAB — BASIC METABOLIC PANEL
BUN: 9 mg/dL (ref 6–23)
CO2: 29 mEq/L (ref 19–32)
Calcium: 9.7 mg/dL (ref 8.4–10.5)
Chloride: 102 mEq/L (ref 96–112)
Creatinine, Ser: 0.98 mg/dL (ref 0.40–1.50)
GFR: 88.28 mL/min (ref >60.00)
Glucose, Bld: 88 mg/dL (ref 70–99)
Potassium: 4 mEq/L (ref 3.5–5.1)
Sodium: 141 mEq/L (ref 135–145)

## 2022-07-01 LAB — LIPID PANEL
Cholesterol: 150 mg/dL (ref 0–200)
HDL: 45.2 mg/dL (ref >39.00)
LDL Cholesterol: 80 mg/dL (ref 0–99)
NonHDL: 105.2
Total CHOL/HDL Ratio: 3
Triglycerides: 124 mg/dL (ref 0.0–149.0)
VLDL: 24.8 mg/dL (ref 0.0–40.0)

## 2022-07-01 LAB — TSH: TSH: 1.11 u[IU]/mL (ref 0.35–5.50)

## 2022-07-01 LAB — HEPATIC FUNCTION PANEL
ALT: 27 U/L (ref 0–53)
AST: 23 U/L (ref 0–37)
Albumin: 4.7 g/dL (ref 3.5–5.2)
Alkaline Phosphatase: 85 U/L (ref 39–117)
Bilirubin, Direct: 0.1 mg/dL (ref 0.0–0.3)
Total Bilirubin: 0.6 mg/dL (ref 0.2–1.2)
Total Protein: 6.9 g/dL (ref 6.0–8.3)

## 2022-07-01 LAB — PSA: PSA: 1.22 ng/mL (ref 0.10–4.00)

## 2022-07-01 NOTE — Telephone Encounter (Signed)
-----   Message from Sheliah Hatch, MD sent at 07/01/2022  2:53 PM EDT ----- Labs look great!  No changes at this time

## 2022-07-01 NOTE — Telephone Encounter (Signed)
Pt aware of lab results 

## 2022-12-21 ENCOUNTER — Other Ambulatory Visit: Payer: Self-pay | Admitting: Family Medicine

## 2022-12-21 NOTE — Telephone Encounter (Signed)
Medication: Rosuvastatin 10 mg  Directions: take 1 tablet by mouth daily  Last given: 06/30/22 Number refills: 1 Last o/v: 06/30/22 Follow up: 07/06/23 Labs:

## 2023-01-17 ENCOUNTER — Encounter: Payer: Self-pay | Admitting: Family Medicine

## 2023-01-17 ENCOUNTER — Ambulatory Visit: Payer: BC Managed Care – PPO | Admitting: Family Medicine

## 2023-01-17 VITALS — BP 138/82 | HR 77 | Temp 97.8°F | Ht 71.0 in | Wt 207.2 lb

## 2023-01-17 DIAGNOSIS — M1A9XX0 Chronic gout, unspecified, without tophus (tophi): Secondary | ICD-10-CM

## 2023-01-17 DIAGNOSIS — Z23 Encounter for immunization: Secondary | ICD-10-CM

## 2023-01-17 LAB — CBC WITH DIFFERENTIAL/PLATELET
Basophils Absolute: 0 10*3/uL (ref 0.0–0.1)
Basophils Relative: 0.4 % (ref 0.0–3.0)
Eosinophils Absolute: 0.1 10*3/uL (ref 0.0–0.7)
Eosinophils Relative: 0.7 % (ref 0.0–5.0)
HCT: 49.9 % (ref 39.0–52.0)
Hemoglobin: 16.8 g/dL (ref 13.0–17.0)
Lymphocytes Relative: 26.6 % (ref 12.0–46.0)
Lymphs Abs: 2.6 10*3/uL (ref 0.7–4.0)
MCHC: 33.6 g/dL (ref 30.0–36.0)
MCV: 89.1 fL (ref 78.0–100.0)
Monocytes Absolute: 0.6 10*3/uL (ref 0.1–1.0)
Monocytes Relative: 6.1 % (ref 3.0–12.0)
Neutro Abs: 6.4 10*3/uL (ref 1.4–7.7)
Neutrophils Relative %: 66.2 % (ref 43.0–77.0)
Platelets: 278 10*3/uL (ref 150.0–400.0)
RBC: 5.61 Mil/uL (ref 4.22–5.81)
RDW: 13.2 % (ref 11.5–15.5)
WBC: 9.7 10*3/uL (ref 4.0–10.5)

## 2023-01-17 LAB — BASIC METABOLIC PANEL
BUN: 13 mg/dL (ref 6–23)
CO2: 32 meq/L (ref 19–32)
Calcium: 10 mg/dL (ref 8.4–10.5)
Chloride: 101 meq/L (ref 96–112)
Creatinine, Ser: 1.04 mg/dL (ref 0.40–1.50)
GFR: 81.89 mL/min (ref >60.00)
Glucose, Bld: 101 mg/dL — ABNORMAL HIGH (ref 70–99)
Potassium: 4.3 meq/L (ref 3.5–5.1)
Sodium: 140 meq/L (ref 135–145)

## 2023-01-17 LAB — URIC ACID: Uric Acid, Serum: 6.6 mg/dL (ref 4.0–7.8)

## 2023-01-17 MED ORDER — ALLOPURINOL 100 MG PO TABS: 100.0000 mg | ORAL_TABLET | Freq: Every day | ORAL | 6 refills | Status: DC

## 2023-01-17 MED ORDER — PREDNISONE 10 MG PO TABS: ORAL_TABLET | ORAL | 0 refills | Status: DC

## 2023-01-17 NOTE — Progress Notes (Signed)
   Subjective:    Patient ID: Cody Hawkins, male    DOB: [birth year], 52 y.o.   MRN: [id number]  HPI Gout- pt has had issues w/ gout over the years.  Pt is point tender over L 1st MTP joint.  Saw ortho last week and was treated w/ Prednisone.  Sxs improved temporarily but pain returned once steroids were done.  Mild improvement w/ Ibuprofen or Naproxen but pain continues.  Area was initially red- still some redness today.  Has been icing.  Pt has been having flares w/ increased frequency.     Review of Systems For ROS see HPI     Objective:   Physical Exam Vitals reviewed.  Constitutional:      General: He is not in acute distress.    Appearance: Normal appearance. He is not ill-appearing.  HENT:     Head: Normocephalic and atraumatic.  Cardiovascular:     Pulses: Normal pulses.  Skin:    General: Skin is warm and dry.     Findings: Erythema (over L 1st MTP joint, TTP) present.  Neurological:     General: No focal deficit present.     Mental Status: He is alert and oriented to person, place, and time.  Psychiatric:        Mood and Affect: Mood normal.        Behavior: Behavior normal.        Thought Content: Thought content normal.           Assessment & Plan:  Gout- new to provider, recurring issue for pt.  Completed a steroid taper last week w/o complete resolution of sxs.  Continues to have redness and pain of 1st MTP joint.  He reports these flares are coming more frequently and is interested in preventing them.  Will repeat a prednisone taper and initiate Allopurinol at 100mg  daily.  Reviewed supportive care and red flags that should prompt return.  Pt expressed understanding and is in agreement w/ plan.

## 2023-01-17 NOTE — Patient Instructions (Addendum)
Follow up as needed or as scheduled We'll notify you of your lab results and make any changes if needed START the Prednisone as directed- 3 pills at the same time x3 days, then 2 pills at the same time x3 days, then 1 pill daily.  Take w/ food  START the Allopurinol daily to prevent/decrease gout flares Continue to ICE HOLD any ibuprofen or Naproxen while on the Prednisone Call with any questions or concerns Hang in there!!!

## 2023-01-18 ENCOUNTER — Telehealth: Payer: Self-pay

## 2023-01-18 NOTE — Telephone Encounter (Signed)
-----   Message from Neena Rhymes sent at 01/17/2023  8:39 PM EDT ----- Labs look good.  Continue w/ plan discussed.

## 2023-01-18 NOTE — Telephone Encounter (Signed)
Left vm to call about labs 

## 2023-01-19 NOTE — Telephone Encounter (Signed)
Seen by patient Cody Hawkins on 01/19/2023  9:09 AM

## 2023-02-10 ENCOUNTER — Encounter: Payer: Self-pay | Admitting: Family Medicine

## 2023-02-10 MED ORDER — ALLOPURINOL 300 MG PO TABS
300.0000 mg | ORAL_TABLET | Freq: Every day | ORAL | 3 refills | Status: DC
Start: 2023-02-10 — End: 2023-06-05

## 2023-02-10 MED ORDER — PREDNISONE 10 MG PO TABS: ORAL_TABLET | ORAL | 0 refills | Status: DC

## 2023-02-10 NOTE — Addendum Note (Signed)
Addended by: Ester Rink on: 02/10/2023 03:39 PM   Modules accepted: Orders

## 2023-02-10 NOTE — Addendum Note (Signed)
Addended by: Sheliah Hatch on: 02/10/2023 12:49 PM   Modules accepted: Orders

## 2023-06-05 ENCOUNTER — Other Ambulatory Visit: Payer: Self-pay | Admitting: Family Medicine

## 2023-06-08 ENCOUNTER — Other Ambulatory Visit: Payer: Self-pay

## 2023-06-08 ENCOUNTER — Encounter: Payer: Self-pay | Admitting: Family Medicine

## 2023-06-08 MED ORDER — ROSUVASTATIN CALCIUM 10 MG PO TABS: 10.0000 mg | ORAL_TABLET | Freq: Every day | ORAL | 0 refills | Status: DC

## 2023-07-06 ENCOUNTER — Encounter: Payer: BC Managed Care – PPO | Admitting: Family Medicine

## 2023-07-20 ENCOUNTER — Ambulatory Visit (INDEPENDENT_AMBULATORY_CARE_PROVIDER_SITE_OTHER): Payer: 59 | Admitting: Family Medicine

## 2023-07-20 ENCOUNTER — Encounter: Payer: Self-pay | Admitting: Family Medicine

## 2023-07-20 VITALS — BP 110/72 | HR 96 | Temp 98.0°F | Wt 210.0 lb

## 2023-07-20 DIAGNOSIS — E785 Hyperlipidemia, unspecified: Secondary | ICD-10-CM

## 2023-07-20 DIAGNOSIS — Z0000 Encounter for general adult medical examination without abnormal findings: Secondary | ICD-10-CM | POA: Diagnosis not present

## 2023-07-20 DIAGNOSIS — Z125 Encounter for screening for malignant neoplasm of prostate: Secondary | ICD-10-CM

## 2023-07-20 DIAGNOSIS — Z1211 Encounter for screening for malignant neoplasm of colon: Secondary | ICD-10-CM

## 2023-07-20 NOTE — Assessment & Plan Note (Signed)
 Pt's PE WNL.  UTD on Tdap.  Due for repeat cologuard- ordered.  Check labs.  Anticipatory guidance provided.

## 2023-07-20 NOTE — Progress Notes (Signed)
   Subjective:    Patient ID: Cody Hawkins, male    DOB: 1970-05-11, 53 y.o.   MRN: [id number]  HPI CPE- UTD on Tdap.  Due for repeat cologuard.  Patient Care Team    Relationship Specialty Notifications Start End  Jess Morita, MD PCP - General Family Medicine  07/04/11      Health Maintenance  Topic Date Due   Zoster Vaccines- Shingrix (1 of 2) Never done   COVID-19 Vaccine (3 - 2024-25 season) 11/20/2022   Fecal DNA (Cologuard)  06/21/2023   INFLUENZA VACCINE  10/20/2023   DTaP/Tdap/Td (3 - Td or Tdap) 07/25/2027   Hepatitis C Screening  Completed   HIV Screening  Completed   HPV VACCINES  Aged Out   Meningococcal B Vaccine  Aged Out      Review of Systems Patient reports no vision/hearing changes, anorexia, fever ,adenopathy, persistant/recurrent hoarseness, swallowing issues, chest pain, palpitations, edema, persistant/recurrent cough, hemoptysis, dyspnea (rest,exertional, paroxysmal nocturnal), gastrointestinal  bleeding (melena, rectal bleeding), abdominal pain, excessive heart burn, GU symptoms (dysuria, hematuria, voiding/incontinence issues) syncope, focal weakness, memory loss, numbness & tingling, skin/hair/nail changes, depression, anxiety, abnormal bruising/bleeding, musculoskeletal symptoms/signs.     Objective:   Physical Exam General Appearance:    Alert, cooperative, no distress, appears stated age  Head:    Normocephalic, without obvious abnormality, atraumatic  Eyes:    PERRL, conjunctiva/corneas clear, EOM's intact, fundi    benign, both eyes       Ears:    Normal TM's and external ear canals, both ears  Nose:   Nares normal, septum midline, mucosa normal, no drainage   or sinus tenderness  Throat:   Lips, mucosa, and tongue normal; teeth and gums normal  Neck:   Supple, symmetrical, trachea midline, no adenopathy;       thyroid :  No enlargement/tenderness/nodules  Back:     Symmetric, no curvature, ROM normal, no CVA tenderness  Lungs:      Clear to auscultation bilaterally, respirations unlabored  Chest wall:    No tenderness or deformity  Heart:    Regular rate and rhythm, S1 and S2 normal, no murmur, rub   or gallop  Abdomen:     Soft, non-tender, bowel sounds active all four quadrants,    no masses, no organomegaly  Genitalia:    Normal male without lesion, masses,discharge or tenderness  Rectal:    Deferred due to young age  Extremities:   Extremities normal, atraumatic, no cyanosis or edema  Pulses:   2+ and symmetric all extremities  Skin:   Skin color, texture, turgor normal, no rashes or lesions  Lymph nodes:   Cervical, supraclavicular, and axillary nodes normal  Neurologic:   CNII-XII intact. Normal strength, sensation and reflexes      throughout          Assessment & Plan:

## 2023-07-20 NOTE — Patient Instructions (Signed)
Follow up in 6 months to recheck cholesterol We'll notify you of your lab results and make any changes if needed Continue to work on healthy diet and regular exercise- you're doing great! Call with any questions or concerns Stay Safe!  Stay Healthy! Happy Spring!!!

## 2023-07-21 LAB — LIPID PANEL
Cholesterol: 188 mg/dL (ref 0–200)
HDL: 43.3 mg/dL (ref >39.00)
LDL Cholesterol: 116 mg/dL — ABNORMAL HIGH (ref 0–99)
NonHDL: 145.13
Total CHOL/HDL Ratio: 4
Triglycerides: 147 mg/dL (ref 0.0–149.0)
VLDL: 29.4 mg/dL (ref 0.0–40.0)

## 2023-07-21 LAB — HEPATIC FUNCTION PANEL
ALT: 36 U/L (ref 0–53)
AST: 26 U/L (ref 0–37)
Albumin: 4.7 g/dL (ref 3.5–5.2)
Alkaline Phosphatase: 95 U/L (ref 39–117)
Bilirubin, Direct: 0.1 mg/dL (ref 0.0–0.3)
Total Bilirubin: 0.6 mg/dL (ref 0.2–1.2)
Total Protein: 6.9 g/dL (ref 6.0–8.3)

## 2023-07-21 LAB — CBC WITH DIFFERENTIAL/PLATELET
Basophils Absolute: 0.1 10*3/uL (ref 0.0–0.1)
Basophils Relative: 1.1 % (ref 0.0–3.0)
Eosinophils Absolute: 0.2 10*3/uL (ref 0.0–0.7)
Eosinophils Relative: 2.9 % (ref 0.0–5.0)
HCT: 46.5 % (ref 39.0–52.0)
Hemoglobin: 15.5 g/dL (ref 13.0–17.0)
Lymphocytes Relative: 41.1 % (ref 12.0–46.0)
Lymphs Abs: 2.4 10*3/uL (ref 0.7–4.0)
MCHC: 33.4 g/dL (ref 30.0–36.0)
MCV: 89.6 fl (ref 78.0–100.0)
Monocytes Absolute: 0.4 10*3/uL (ref 0.1–1.0)
Monocytes Relative: 7.4 % (ref 3.0–12.0)
Neutro Abs: 2.8 10*3/uL (ref 1.4–7.7)
Neutrophils Relative %: 47.5 % (ref 43.0–77.0)
Platelets: 218 10*3/uL (ref 150.0–400.0)
RBC: 5.19 Mil/uL (ref 4.22–5.81)
RDW: 13.1 % (ref 11.5–15.5)
WBC: 5.8 10*3/uL (ref 4.0–10.5)

## 2023-07-21 LAB — BASIC METABOLIC PANEL WITH GFR
BUN: 10 mg/dL (ref 6–23)
CO2: 31 meq/L (ref 19–32)
Calcium: 9.7 mg/dL (ref 8.4–10.5)
Chloride: 101 meq/L (ref 96–112)
Creatinine, Ser: 0.86 mg/dL (ref 0.40–1.50)
GFR: 98.4 mL/min (ref >60.00)
Glucose, Bld: 93 mg/dL (ref 70–99)
Potassium: 3.9 meq/L (ref 3.5–5.1)
Sodium: 140 meq/L (ref 135–145)

## 2023-07-21 LAB — TSH: TSH: 1.47 u[IU]/mL (ref 0.35–5.50)

## 2023-07-21 LAB — PSA: PSA: 1.23 ng/mL (ref 0.10–4.00)

## 2023-07-24 ENCOUNTER — Encounter: Payer: Self-pay | Admitting: Family Medicine

## 2023-08-07 LAB — COLOGUARD

## 2023-10-03 LAB — COLOGUARD: COLOGUARD: NEGATIVE

## 2023-10-04 ENCOUNTER — Ambulatory Visit: Payer: Self-pay | Admitting: Family Medicine

## 2023-11-21 ENCOUNTER — Encounter: Payer: Self-pay | Admitting: Family Medicine

## 2023-11-21 DIAGNOSIS — Z1283 Encounter for screening for malignant neoplasm of skin: Secondary | ICD-10-CM

## 2023-11-21 NOTE — Telephone Encounter (Signed)
 Dr. Mahlon, do you recommend a local dermatologist? Please see message below for more details.

## 2023-12-05 ENCOUNTER — Encounter: Payer: Self-pay | Admitting: Family Medicine

## 2023-12-05 ENCOUNTER — Ambulatory Visit: Admitting: Family Medicine

## 2023-12-05 VITALS — BP 112/62 | HR 94 | Temp 98.3°F | Ht 72.0 in | Wt 206.0 lb

## 2023-12-05 DIAGNOSIS — M940 Chondrocostal junction syndrome [Tietze]: Secondary | ICD-10-CM | POA: Diagnosis not present

## 2023-12-05 DIAGNOSIS — Z23 Encounter for immunization: Secondary | ICD-10-CM

## 2023-12-05 MED ORDER — NAPROXEN 500 MG PO TABS
500.0000 mg | ORAL_TABLET | Freq: Two times a day (BID) | ORAL | 0 refills | Status: DC
Start: 2023-12-05 — End: 2024-01-04

## 2023-12-05 NOTE — Progress Notes (Signed)
   Subjective:    Patient ID: Cody Hawkins, male    DOB: [birth year], 53 y.o.   MRN: [id number]  HPI L rib pain- sxs are very localized to L anterior lower rib.  Started 3-4 weeks ago.  Pain is intermittent.  Worse w/ coughing, laughing, certain movements.  Feels 'like hot muscle pain'.  No known injury but very active around the house.  Pt reports sxs are improved today   Review of Systems For ROS see HPI     Objective:   Physical Exam Vitals reviewed.  Constitutional:      General: He is not in acute distress.    Appearance: He is well-developed. He is not ill-appearing.  HENT:     Head: Normocephalic and atraumatic.  Chest:     Chest wall: Tenderness (over L 12th rib cartilaginous attachment) present.  Skin:    General: Skin is warm and dry.  Neurological:     General: No focal deficit present.     Mental Status: He is alert and oriented to person, place, and time.  Psychiatric:        Mood and Affect: Mood normal.        Behavior: Behavior normal.        Thought Content: Thought content normal.           Assessment & Plan:  Costochondritis- new.  Location and description of pain are consistent w/ dx.  Sxs are improved today.  Will provide prescription strength NSAIDs to use prn.  Pt expressed understanding and is in agreement w/ plan.

## 2023-12-05 NOTE — Patient Instructions (Signed)
 Follow up as needed or as scheduled START the Naproxen  twice daily- take w/ food- for 5-7 days and then as needed This is consistent w/ costochondritis- inflammation of the rib cartilage- and should improve w/ anti-inflammatories and time Call with any questions or concerns Stay Safe!  Stay Healthy! Happy Fall!!!

## 2024-01-04 ENCOUNTER — Ambulatory Visit: Admitting: Dermatology

## 2024-01-04 ENCOUNTER — Encounter: Payer: Self-pay | Admitting: Dermatology

## 2024-01-04 VITALS — BP 159/102 | HR 97

## 2024-01-04 DIAGNOSIS — D229 Melanocytic nevi, unspecified: Secondary | ICD-10-CM

## 2024-01-04 DIAGNOSIS — D2371 Other benign neoplasm of skin of right lower limb, including hip: Secondary | ICD-10-CM | POA: Diagnosis not present

## 2024-01-04 DIAGNOSIS — Z1283 Encounter for screening for malignant neoplasm of skin: Secondary | ICD-10-CM

## 2024-01-04 DIAGNOSIS — L814 Other melanin hyperpigmentation: Secondary | ICD-10-CM

## 2024-01-04 DIAGNOSIS — D2372 Other benign neoplasm of skin of left lower limb, including hip: Secondary | ICD-10-CM | POA: Diagnosis not present

## 2024-01-04 DIAGNOSIS — D489 Neoplasm of uncertain behavior, unspecified: Secondary | ICD-10-CM

## 2024-01-04 DIAGNOSIS — L578 Other skin changes due to chronic exposure to nonionizing radiation: Secondary | ICD-10-CM

## 2024-01-04 DIAGNOSIS — D1801 Hemangioma of skin and subcutaneous tissue: Secondary | ICD-10-CM

## 2024-01-04 DIAGNOSIS — L821 Other seborrheic keratosis: Secondary | ICD-10-CM

## 2024-01-04 NOTE — Patient Instructions (Addendum)

## 2024-01-04 NOTE — Progress Notes (Signed)
   New Patient Visit   Subjective  Cody Hawkins is a 53 y.o. male who presents for the following:  Total Body Skin Exam (TBSE)  Patient present today for new patient visit for TBSE.The patient denies he has spots, moles and lesions to be evaluated. Patient reports his last skin exam was many years ago. Patient reports he does not have hx of bx. Patient says there is possible family history of skin cancers with his mom. Patient reports throughout his lifetime has had moderate sun exposure. Currently, patient reports if he has excessive sun exposure, he does apply sunscreen and/or wears protective coverings.  The following portions of the chart were reviewed this encounter and updated as appropriate: medications, allergies, medical history  Review of Systems:  No other skin or systemic complaints except as noted in HPI or Assessment and Plan.  Objective  Well appearing patient in no apparent distress; mood and affect are within normal limits.  A full examination was performed including scalp, head, eyes, ears, nose, lips, neck, chest, axillae, abdomen, back, buttocks, bilateral upper extremities, bilateral lower extremities, hands, feet, fingers, toes, fingernails, and toenails. All findings within normal limits unless otherwise noted below.     Relevant exam findings are noted in the Assessment and Plan.      Assessment & Plan   LENTIGINES, SEBORRHEIC KERATOSES, HEMANGIOMAS - Benign normal skin lesions - Benign-appearing - Call for any changes  MELANOCYTIC NEVI - Tan-brown and/or pink-flesh-colored symmetric macules and papules - Benign appearing on exam today - Observation - Call clinic for new or changing moles - Recommend daily use of broad spectrum spf 30+ sunscreen to sun-exposed areas.   ACTINIC DAMAGE - Chronic condition, secondary to cumulative UV/sun exposure - diffuse scaly erythematous macules with underlying dyspigmentation - Recommend daily broad spectrum  sunscreen SPF 30+ to sun-exposed areas, reapply every 2 hours as needed.  - Staying in the shade or wearing long sleeves, sun glasses (UVA+UVB protection) and wide brim hats (4-inch brim around the entire circumference of the hat) are also recommended for sun protection.  - Call for new or changing lesions.  SKIN CANCER SCREENING PERFORMED TODAY NEOPLASM OF UNCERTAIN BEHAVIOR (2) Left Lower Leg - Anterior Epidermal / dermal shaving  Lesion diameter (cm):  0.4 Informed consent: discussed and consent obtained   Timeout: patient name, date of birth, surgical site, and procedure verified   Instrument used: DermaBlade   Hemostasis achieved with: aluminum chloride   Outcome: patient tolerated procedure well   Post-procedure details: wound care instructions given   Additional details:  400 irregular brown papule  Specimen A - Surgical pathology Differential Diagnosis: r/o DN  Check Margins: No Right Calf Epidermal / dermal shaving  Lesion diameter (cm):  0.4 Informed consent: discussed and consent obtained   Timeout: patient name, date of birth, surgical site, and procedure verified   Instrument used: DermaBlade   Hemostasis achieved with: aluminum chloride   Outcome: patient tolerated procedure well   Post-procedure details: wound care instructions given   Additional details:  4mm irregular brown papule  Specimen B - Surgical pathology Differential Diagnosis: r/o DN  Check Margins: No    Return in about 1 year (around 01/03/2025) for FBSE F/U.  I, Lyle Cords, as acting as a Neurosurgeon for [employer], DO .  Documentation: I have reviewed the above documentation for accuracy and completeness, and I agree with the above.  Delon Lenis, DO

## 2024-01-05 ENCOUNTER — Other Ambulatory Visit: Payer: Self-pay

## 2024-01-05 LAB — SURGICAL PATHOLOGY

## 2024-01-05 MED ORDER — ROSUVASTATIN CALCIUM 10 MG PO TABS: 10.0000 mg | ORAL_TABLET | Freq: Every day | ORAL | 0 refills | Status: DC

## 2024-01-09 ENCOUNTER — Ambulatory Visit: Payer: Self-pay | Admitting: Dermatology

## 2024-01-26 ENCOUNTER — Encounter: Payer: Self-pay | Admitting: Dermatology

## 2024-02-26 ENCOUNTER — Ambulatory Visit: Admitting: Dermatology

## 2024-04-05 ENCOUNTER — Other Ambulatory Visit: Payer: Self-pay | Admitting: Family Medicine

## 2025-01-08 ENCOUNTER — Ambulatory Visit: Admitting: Dermatology
